# Patient Record
Sex: Female | Born: 1970 | Race: Black or African American | Hispanic: No | Marital: Single | Smoking: Never smoker
Health system: Southern US, Community
[De-identification: ages and names within clinical notes are randomized; demographics above are authoritative.]

## PROBLEM LIST (undated history)

## (undated) DIAGNOSIS — I10 Essential (primary) hypertension: Secondary | ICD-10-CM

## (undated) DIAGNOSIS — J45909 Unspecified asthma, uncomplicated: Secondary | ICD-10-CM

## (undated) HISTORY — PX: CHOLECYSTECTOMY: SHX55

## (undated) HISTORY — PX: IR EMBO TUMOR ORGAN ISCHEMIA INFARCT INC GUIDE ROADMAPPING: IMG5449

## (undated) HISTORY — DX: Unspecified asthma, uncomplicated: J45.909

## (undated) HISTORY — DX: Essential (primary) hypertension: I10

---

## 2020-02-14 ENCOUNTER — Emergency Department (HOSPITAL_BASED_OUTPATIENT_CLINIC_OR_DEPARTMENT_OTHER)
Admission: EM | Admit: 2020-02-14 | Discharge: 2020-02-14 | Disposition: A | Discharge status: Home / Self Care | Payer: BC Managed Care – PPO | Attending: Emergency Medicine | Admitting: Emergency Medicine

## 2020-02-14 ENCOUNTER — Emergency Department (HOSPITAL_BASED_OUTPATIENT_CLINIC_OR_DEPARTMENT_OTHER): Payer: BC Managed Care – PPO

## 2020-02-14 ENCOUNTER — Other Ambulatory Visit: Payer: Self-pay

## 2020-02-14 ENCOUNTER — Encounter (HOSPITAL_BASED_OUTPATIENT_CLINIC_OR_DEPARTMENT_OTHER): Payer: Self-pay | Admitting: Emergency Medicine

## 2020-02-14 DIAGNOSIS — I10 Essential (primary) hypertension: Secondary | ICD-10-CM | POA: Insufficient documentation

## 2020-02-14 DIAGNOSIS — R05 Cough: Secondary | ICD-10-CM | POA: Diagnosis not present

## 2020-02-14 DIAGNOSIS — M47814 Spondylosis without myelopathy or radiculopathy, thoracic region: Secondary | ICD-10-CM | POA: Diagnosis not present

## 2020-02-14 DIAGNOSIS — J45909 Unspecified asthma, uncomplicated: Secondary | ICD-10-CM | POA: Diagnosis not present

## 2020-02-14 DIAGNOSIS — R531 Weakness: Secondary | ICD-10-CM | POA: Diagnosis not present

## 2020-02-14 DIAGNOSIS — D509 Iron deficiency anemia, unspecified: Secondary | ICD-10-CM | POA: Diagnosis not present

## 2020-02-14 DIAGNOSIS — R000 Tachycardia, unspecified: Secondary | ICD-10-CM | POA: Diagnosis not present

## 2020-02-14 DIAGNOSIS — U071 COVID-19: Secondary | ICD-10-CM | POA: Insufficient documentation

## 2020-02-14 DIAGNOSIS — Z9049 Acquired absence of other specified parts of digestive tract: Secondary | ICD-10-CM | POA: Diagnosis not present

## 2020-02-14 DIAGNOSIS — R0602 Shortness of breath: Secondary | ICD-10-CM | POA: Diagnosis not present

## 2020-02-14 LAB — URINALYSIS, ROUTINE W REFLEX MICROSCOPIC
Bilirubin Urine: NEGATIVE
Glucose, UA: NEGATIVE mg/dL
Hgb urine dipstick: NEGATIVE
Ketones, ur: NEGATIVE mg/dL
Leukocytes,Ua: NEGATIVE
Nitrite: NEGATIVE
Protein, ur: 100 mg/dL — AB
Specific Gravity, Urine: 1.015 (ref 1.005–1.030)
pH: 7 (ref 5.0–8.0)

## 2020-02-14 LAB — URINALYSIS, MICROSCOPIC (REFLEX)

## 2020-02-14 LAB — COMPREHENSIVE METABOLIC PANEL
ALT: 12 U/L (ref 0–44)
AST: 21 U/L (ref 15–41)
Albumin: 3.7 g/dL (ref 3.5–5.0)
Alkaline Phosphatase: 55 U/L (ref 38–126)
Anion gap: 13 (ref 5–15)
BUN: 14 mg/dL (ref 6–20)
CO2: 24 mmol/L (ref 22–32)
Calcium: 8.8 mg/dL — ABNORMAL LOW (ref 8.9–10.3)
Chloride: 102 mmol/L (ref 98–111)
Creatinine, Ser: 1.15 mg/dL — ABNORMAL HIGH (ref 0.44–1.00)
GFR calc Af Amer: >60 mL/min (ref >60)
GFR calc non Af Amer: 56 mL/min — ABNORMAL LOW (ref >60)
Glucose, Bld: 123 mg/dL — ABNORMAL HIGH (ref 70–99)
Potassium: 3.4 mmol/L — ABNORMAL LOW (ref 3.5–5.1)
Sodium: 139 mmol/L (ref 135–145)
Total Bilirubin: 0.5 mg/dL (ref 0.3–1.2)
Total Protein: 7.9 g/dL (ref 6.5–8.1)

## 2020-02-14 LAB — D-DIMER, QUANTITATIVE: D-Dimer, Quant: 1.29 ug/mL-FEU — ABNORMAL HIGH (ref 0.00–0.50)

## 2020-02-14 MED ORDER — SODIUM CHLORIDE 0.9 % IV BOLUS
1000.0000 mL | Freq: Once | INTRAVENOUS | Status: AC
  Administered 2020-02-14: 1000 mL via INTRAVENOUS

## 2020-02-14 MED ORDER — IOHEXOL 350 MG/ML SOLN
100.0000 mL | Freq: Once | INTRAVENOUS | Status: AC | PRN
  Administered 2020-02-14: 100 mL via INTRAVENOUS

## 2020-02-14 NOTE — ED Provider Notes (Signed)
[facility] EMERGENCY DEPARTMENT Provider Note   CSN: [id number] Arrival date & time: 02/14/20  1546     History Chief Complaint  Patient presents with  . Cough  . Weakness    Diana Johnson is a 49 y.o. female.  Patient is a 49 year old female with a history of asthma and hypertension who presents with weakness and fatigue following a Covid infection in August.  She was diagnosed with Covid with symptoms starting around August 10.  She said she was at home for about 2 weeks feeling bad but then she went back to work on September 3.  She said she has had ongoing profound fatigue and generalized weakness.  She gets fatigued with minimal activity.  She said yesterday she had a syncopal episode.  She has some shortness of breath but not with her normal activities.  No associated chest pain.  She still having some intermittent fevers up to 101.  She is taking Tylenol to control the fevers.  She denies any nausea, vomiting or diarrhea.  No urinary symptoms.  She does still have coughing spells.  No ongoing congestion.        Past Medical History:  Diagnosis Date  . Asthma   . Hypertension     There are no problems to display for this patient.    The histories are not reviewed yet. Please review them in the "History" navigator section and refresh this SmartLink.   OB History   No obstetric history on file.     No family history on file.  Social History   Tobacco Use  . Smoking status: Never Smoker  . Smokeless tobacco: Never Used  Substance Use Topics  . Alcohol use: Never  . Drug use: Never    Home Medications Prior to Admission medications   Not on File    Allergies    Patient has no allergy information on record.  Review of Systems   Review of Systems  Constitutional: Positive for fatigue and fever. Negative for chills and diaphoresis.  HENT: Negative for congestion, rhinorrhea and sneezing.   Eyes: Negative.   Respiratory: Positive for  cough and shortness of breath. Negative for chest tightness.   Cardiovascular: Negative for chest pain and leg swelling.  Gastrointestinal: Negative for abdominal pain, blood in stool, diarrhea, nausea and vomiting.  Genitourinary: Negative for difficulty urinating, flank pain, frequency and hematuria.  Musculoskeletal: Negative for arthralgias and back pain.  Skin: Negative for rash.  Neurological: Positive for syncope. Negative for dizziness, speech difficulty, weakness, numbness and headaches.    Physical Exam Updated Vital Signs BP (!) 164/100 (BP Location: Left Arm)   Pulse 95   Temp 98.3 F (36.8 C) (Oral)   Resp 16   LMP 01/31/2020 (Approximate)   SpO2 100%   Physical Exam Constitutional:      Appearance: She is well-developed.  HENT:     Head: Normocephalic and atraumatic.  Eyes:     Pupils: Pupils are equal, round, and reactive to light.  Cardiovascular:     Rate and Rhythm: Regular rhythm. Tachycardia present.     Heart sounds: Normal heart sounds.  Pulmonary:     Effort: Pulmonary effort is normal. No respiratory distress.     Breath sounds: Normal breath sounds. No wheezing or rales.  Chest:     Chest wall: No tenderness.  Abdominal:     General: Bowel sounds are normal.     Palpations: Abdomen is soft.     Tenderness: There  is no abdominal tenderness. There is no guarding or rebound.  Musculoskeletal:        General: Normal range of motion.     Cervical back: Normal range of motion and neck supple.     Comments: No edema or calf tenderness  Lymphadenopathy:     Cervical: No cervical adenopathy.  Skin:    General: Skin is warm and dry.     Findings: No rash.  Neurological:     Mental Status: She is alert and oriented to person, place, and time.     ED Results / Procedures / Treatments   Labs (all labs ordered are listed, but only abnormal results are displayed) Labs Reviewed  COMPREHENSIVE METABOLIC PANEL - Abnormal; Notable for the following  components:      Result Value   Potassium 3.4 (*)    Glucose, Bld 123 (*)    Creatinine, Ser 1.15 (*)    Calcium 8.8 (*)    GFR calc non Af Amer 56 (*)    All other components within normal limits  CBC WITH DIFFERENTIAL/PLATELET - Abnormal; Notable for the following components:   WBC 11.0 (*)    Hemoglobin 8.3 (*)    HCT 31.2 (*)    MCV 65.4 (*)    MCH 17.4 (*)    MCHC 26.6 (*)    RDW 23.9 (*)    Platelets 418 (*)    Eosinophils Absolute 0.6 (*)    All other components within normal limits  URINALYSIS, ROUTINE W REFLEX MICROSCOPIC - Abnormal; Notable for the following components:   Protein, ur 100 (*)    All other components within normal limits  D-DIMER, QUANTITATIVE (NOT AT [facility]) - Abnormal; Notable for the following components:   D-Dimer, Quant 1.29 (*)    All other components within normal limits  URINALYSIS, MICROSCOPIC (REFLEX) - Abnormal; Notable for the following components:   Bacteria, UA FEW (*)    All other components within normal limits    EKG EKG Interpretation  Date/Time:  Monday February 14 2020 20:22:35 EDT Ventricular Rate:  96 PR Interval:    QRS Duration: 90 QT Interval:  337 QTC Calculation: 426 R Axis:   59 Text Interpretation: Sinus rhythm Probable LVH with secondary repol abnrm No old tracing to compare Confirmed by Rolan Bucco [phone]) on 02/14/2020 8:46:31 PM   Radiology CT Angio Chest PE W/Cm &/Or Wo Cm  Result Date: 02/14/2020 CLINICAL DATA:  Cough and weakness with history of COVID-19 positivity EXAM: CT ANGIOGRAPHY CHEST WITH CONTRAST TECHNIQUE: Multidetector CT imaging of the chest was performed using the standard protocol during bolus administration of intravenous contrast. Multiplanar CT image reconstructions and MIPs were obtained to evaluate the vascular anatomy. CONTRAST:  OMNIPAQUE IOHEXOL 350 MG/ML SOLN COMPARISON:  Chest x-ray from earlier in the same day. FINDINGS: Cardiovascular: Thoracic aorta and its branches are within  normal limits. No cardiac enlargement is seen. The pulmonary artery shows a normal branching pattern. No definitive filling defect to suggest pulmonary embolism is noted. Mediastinum/Nodes: Thoracic inlet is within normal limits. The esophagus as visualized is unremarkable. No sizable hilar or mediastinal adenopathy is noted. Lungs/Pleura: Lungs are well aerated bilaterally. Minimal ground-glass opacities are seen consistent with residual changes from the patient's known history of COVID-19 positivity. No sizable confluent infiltrate is seen. No pneumothorax or pleural effusion is noted. Upper Abdomen: Changes of prior cholecystectomy are seen. The remainder of the upper abdomen appears within normal limits. Musculoskeletal: Degenerative changes of the thoracic spine  are seen. No acute bony abnormality is noted. Review of the MIP images confirms the above findings. IMPRESSION: No evidence of pulmonary emboli. Minimal ground-glass opacities within the lungs bilaterally consistent with the known history of prior COVID-19 positivity. No other focal abnormality is seen. Electronically Signed   By: Alcide Clever M.D.   On: 02/14/2020 21:13   DG Chest Portable 1 View  Result Date: 02/14/2020 CLINICAL DATA:  Cough. EXAM: PORTABLE CHEST 1 VIEW COMPARISON:  None. FINDINGS: The heart size and mediastinal contours are within normal limits. Both lungs are clear. The visualized skeletal structures are unremarkable. IMPRESSION: No active disease. Electronically Signed   By: Aram Candela M.D.   On: 02/14/2020 18:41    Procedures Procedures (including critical care time)  Medications Ordered in ED Medications  sodium chloride 0.9 % bolus 1,000 mL ( Intravenous Stopped 02/14/20 2138)  iohexol (OMNIPAQUE) 350 MG/ML injection 100 mL (100 mLs Intravenous Contrast Given 02/14/20 2102)    ED Course  I have reviewed the triage vital signs and the nursing notes.  Pertinent labs & imaging results that were available  during my care of the patient were reviewed by me and considered in my medical decision making (see chart for details).    MDM Rules/Calculators/A&P                          Patient is a 49 year old female who had Covid last month.  She has had some ongoing weakness and had a syncopal episode yesterday.  She was mildly tachycardic on arrival but this is improved and normalized after IV fluids.  Her labs show an anemia with a hemoglobin around 8.  She says that this is normal for her.  She says her hemoglobin normally runs between 8 and 10.  She does have heavy menstrual cycles at times.  She supposed to be taking iron supplements but has not been taking them.  I encouraged her to take them.  Her creatinine is minimally elevated, likely due to some mild dehydration.  She was given IV fluids.  She had an elevated D-dimer and had a CT scan of her chest.  There is no evidence of PE.  There are some typical Covid lung findings.  No evidence of acute pneumonia.  Her other lab work is nonconcerning.  She is not hypoxic.  She is feeling somewhat better after IV fluids.  She was discharged home in good condition.  She was given a referral to follow-up with the post Covid clinic.  Return precautions were given. Final Clinical Impression(s) / ED Diagnoses Final diagnoses:  COVID-19 virus infection  Iron deficiency anemia, unspecified iron deficiency anemia type    Rx / DC Orders ED Discharge Orders    None       Rolan Bucco, MD 02/14/20 2255

## 2020-02-14 NOTE — Discharge Instructions (Addendum)
Make sure that you are staying hydrated.  Take iron supplements.  Make an appointment to follow-up with the Covid follow-up clinic.  Return here as needed if you have any worsening symptoms.

## 2020-02-14 NOTE — ED Notes (Signed)
PT aware of need for UA 

## 2020-02-14 NOTE — ED Triage Notes (Signed)
Here with cough during exertion and weakness that is still persistent from having COVID in August.

## 2020-02-15 LAB — CBC WITH DIFFERENTIAL/PLATELET
Abs Immature Granulocytes: 0.03 10*3/uL (ref 0.00–0.07)
Basophils Absolute: 0.1 10*3/uL (ref 0.0–0.1)
Basophils Relative: 1 %
Eosinophils Absolute: 0.6 10*3/uL — ABNORMAL HIGH (ref 0.0–0.5)
Eosinophils Relative: 6 %
HCT: 31.2 % — ABNORMAL LOW (ref 36.0–46.0)
Hemoglobin: 8.3 g/dL — ABNORMAL LOW (ref 12.0–15.0)
Immature Granulocytes: 0 %
Lymphocytes Relative: 19 %
Lymphs Abs: 2.1 10*3/uL (ref 0.7–4.0)
MCH: 17.4 pg — ABNORMAL LOW (ref 26.0–34.0)
MCHC: 26.6 g/dL — ABNORMAL LOW (ref 30.0–36.0)
MCV: 65.4 fL — ABNORMAL LOW (ref 80.0–100.0)
Monocytes Absolute: 0.9 10*3/uL (ref 0.1–1.0)
Monocytes Relative: 8 %
Neutro Abs: 7.3 10*3/uL (ref 1.7–7.7)
Neutrophils Relative %: 66 %
Platelets: 418 10*3/uL — ABNORMAL HIGH (ref 150–400)
RBC: 4.77 MIL/uL (ref 3.87–5.11)
RDW: 23.9 % — ABNORMAL HIGH (ref 11.5–15.5)
WBC: 11 10*3/uL — ABNORMAL HIGH (ref 4.0–10.5)
nRBC: 0 % (ref 0.0–0.2)

## 2020-02-18 ENCOUNTER — Telehealth: Payer: Self-pay | Admitting: General Practice

## 2020-02-18 NOTE — Telephone Encounter (Signed)
Pt called to schedule appt w/ [facility] per referral from [facility]. Unable to lvm bc of full mailbox

## 2020-02-25 ENCOUNTER — Ambulatory Visit (INDEPENDENT_AMBULATORY_CARE_PROVIDER_SITE_OTHER): Payer: BC Managed Care – PPO | Admitting: Nurse Practitioner

## 2020-02-25 ENCOUNTER — Other Ambulatory Visit: Payer: Self-pay

## 2020-02-25 VITALS — BP 138/82 | HR 107 | Temp 97.3°F | Ht 67.0 in | Wt 220.0 lb

## 2020-02-25 DIAGNOSIS — J4520 Mild intermittent asthma, uncomplicated: Secondary | ICD-10-CM

## 2020-02-25 DIAGNOSIS — Z8616 Personal history of COVID-19: Secondary | ICD-10-CM | POA: Diagnosis not present

## 2020-02-25 MED ORDER — AZITHROMYCIN 250 MG PO TABS: ORAL_TABLET | ORAL | 0 refills | Status: DC

## 2020-02-25 MED ORDER — BENZONATATE 200 MG PO CAPS: 200.0000 mg | ORAL_CAPSULE | Freq: Two times a day (BID) | ORAL | 0 refills | Status: DC | PRN

## 2020-02-25 MED ORDER — ALBUTEROL SULFATE HFA 108 (90 BASE) MCG/ACT IN AERS: 2.0000 | INHALATION_SPRAY | Freq: Four times a day (QID) | RESPIRATORY_TRACT | 0 refills | Status: AC | PRN

## 2020-02-25 MED ORDER — PREDNISONE 20 MG PO TABS: 20.0000 mg | ORAL_TABLET | Freq: Every day | ORAL | 0 refills | Status: AC

## 2020-02-25 MED ORDER — FLUCONAZOLE 150 MG PO TABS: 150.0000 mg | ORAL_TABLET | Freq: Once | ORAL | 0 refills | Status: AC

## 2020-02-25 MED ORDER — BUDESONIDE-FORMOTEROL FUMARATE 80-4.5 MCG/ACT IN AERO: 2.0000 | INHALATION_SPRAY | Freq: Two times a day (BID) | RESPIRATORY_TRACT | 3 refills | Status: AC

## 2020-02-25 MED ORDER — MONTELUKAST SODIUM 10 MG PO TABS: 10.0000 mg | ORAL_TABLET | Freq: Every day | ORAL | 3 refills | Status: DC

## 2020-02-25 NOTE — Progress Notes (Signed)
@Patient  ID: , female    DOB: [birth year], 49 y.o.   MRN: [id number]  Chief Complaint  Patient presents with  . New Patient (Initial Visit)    COVID Pos: 8/10 Sx; Fever, fatigue, cough     Referring provider: No ref. provider found   49 year old female with history of hypertension and asthma.  Diagnosed with Covid on 01/04/2020.  HPI  Patient presents today for post COVID care clinic visit.  She was diagnosed with Covid on 1821.  She had an ED visit on 02/14/2020.  Chest x-ray did show pneumonia.  Patient did not receive any treatment.  She has been using supportive measures at home.  She states she continues to have cough and shortness of breath at times.  She also complains of ongoing fatigue.  Patient does have a history of asthma but has not recently been on maintenance medication because she had lost her insurance and did not have a PCP anymore.  She now has insurance and we gave her a list of providers in the area that are accepting new patients.  Will restart patient on Symbicort and Singulair today. Denies f/c/s, n/v/d, hemoptysis, PND, chest pain or edema.      Allergies  Allergen Reactions  . Shellfish Allergy      There is no immunization history on file for this patient.  Past Medical History:  Diagnosis Date  . Asthma   . Hypertension     Tobacco History: Social History   Tobacco Use  Smoking Status Never Smoker  Smokeless Tobacco Never Used   Counseling given: Not Answered   Outpatient Encounter Medications as of 02/25/2020  Medication Sig  . albuterol (VENTOLIN HFA) 108 (90 Base) MCG/ACT inhaler Inhale 2 puffs into the lungs every 6 (six) hours as needed for wheezing or shortness of breath.  04/26/2020 azithromycin (ZITHROMAX) 250 MG tablet Take 2 tablets (500 mg) on day 1, then take 1 tablet (250 mg) on days 2-5  . benzonatate (TESSALON) 200 MG capsule Take 1 capsule (200 mg total) by mouth 2 (two) times daily as needed for cough.  .  budesonide-formoterol (SYMBICORT) 80-4.5 MCG/ACT inhaler Inhale 2 puffs into the lungs 2 (two) times daily.  . fluconazole (DIFLUCAN) 150 MG tablet Take 1 tablet (150 mg total) by mouth once for 1 dose.  . montelukast (SINGULAIR) 10 MG tablet Take 1 tablet (10 mg total) by mouth at bedtime.  . predniSONE (DELTASONE) 20 MG tablet Take 1 tablet (20 mg total) by mouth daily with breakfast for 5 days.   No facility-administered encounter medications on file as of 02/25/2020.     Review of Systems  Review of Systems  Constitutional: Positive for fatigue. Negative for fever.  HENT: Negative.   Respiratory: Positive for cough and shortness of breath.   Cardiovascular: Negative.  Negative for chest pain, palpitations and leg swelling.  Gastrointestinal: Negative.   Allergic/Immunologic: Negative.   Neurological: Negative.   Psychiatric/Behavioral: Negative.        Physical Exam  BP 138/82 (BP Location: Right Arm)   Pulse (!) 107   Temp (!) 97.3 F (36.3 C)   Ht 5\' 7"  (1.702 m)   Wt 220 lb (99.8 kg)   LMP 01/31/2020 (Approximate)   SpO2 97%   BMI 34.46 kg/m   Wt Readings from Last 5 Encounters:  02/25/20 220 lb (99.8 kg)     Physical Exam Vitals and nursing note reviewed.  Constitutional:      General: She is not  in acute distress.    Appearance: She is well-developed.  Cardiovascular:     Rate and Rhythm: Normal rate and regular rhythm.  Pulmonary:     Effort: Pulmonary effort is normal.     Breath sounds: Normal breath sounds.  Musculoskeletal:     Right lower leg: No edema.     Left lower leg: No edema.  Neurological:     Mental Status: She is alert and oriented to person, place, and time.  Psychiatric:        Mood and Affect: Mood normal.        Behavior: Behavior normal.       Imaging: CT Angio Chest PE W/Cm &/Or Wo Cm  Result Date: 02/14/2020 CLINICAL DATA:  Cough and weakness with history of COVID-19 positivity EXAM: CT ANGIOGRAPHY CHEST WITH CONTRAST  TECHNIQUE: Multidetector CT imaging of the chest was performed using the standard protocol during bolus administration of intravenous contrast. Multiplanar CT image reconstructions and MIPs were obtained to evaluate the vascular anatomy. CONTRAST:  OMNIPAQUE IOHEXOL 350 MG/ML SOLN COMPARISON:  Chest x-ray from earlier in the same day. FINDINGS: Cardiovascular: Thoracic aorta and its branches are within normal limits. No cardiac enlargement is seen. The pulmonary artery shows a normal branching pattern. No definitive filling defect to suggest pulmonary embolism is noted. Mediastinum/Nodes: Thoracic inlet is within normal limits. The esophagus as visualized is unremarkable. No sizable hilar or mediastinal adenopathy is noted. Lungs/Pleura: Lungs are well aerated bilaterally. Minimal ground-glass opacities are seen consistent with residual changes from the patient's known history of COVID-19 positivity. No sizable confluent infiltrate is seen. No pneumothorax or pleural effusion is noted. Upper Abdomen: Changes of prior cholecystectomy are seen. The remainder of the upper abdomen appears within normal limits. Musculoskeletal: Degenerative changes of the thoracic spine are seen. No acute bony abnormality is noted. Review of the MIP images confirms the above findings. IMPRESSION: No evidence of pulmonary emboli. Minimal ground-glass opacities within the lungs bilaterally consistent with the known history of prior COVID-19 positivity. No other focal abnormality is seen. Electronically Signed   By: Alcide Clever M.D.   On: 02/14/2020 21:13   DG Chest Portable 1 View  Result Date: 02/14/2020 CLINICAL DATA:  Cough. EXAM: PORTABLE CHEST 1 VIEW COMPARISON:  None. FINDINGS: The heart size and mediastinal contours are within normal limits. Both lungs are clear. The visualized skeletal structures are unremarkable. IMPRESSION: No active disease. Electronically Signed   By: Aram Candela M.D.   On: 02/14/2020 18:41      Assessment & Plan:   History of COVID-19 Cough:   Stay well hydrated  Stay active  Deep breathing exercises  May start vitamin C 2,000 mg daily, vitamin D3 2,000 IU daily, Zinc 220 mg daily, and Quercetin 500 mg twice daily  May take tylenol or fever or pain  May take mucinex DM twice daily  Will order azithromycin  Will order prednisone  Will order tessalon perles  History of asthma:  Will order albuterol  Will order Symbicort  Will order Singulair    Follow up:  Follow up in 4 weeks or sooner if needed - will need repeat chest xray      Ivonne Andrew, NP 02/25/2020

## 2020-02-25 NOTE — Assessment & Plan Note (Signed)
Cough:   Stay well hydrated  Stay active  Deep breathing exercises  May start vitamin C 2,000 mg daily, vitamin D3 2,000 IU daily, Zinc 220 mg daily, and Quercetin 500 mg twice daily  May take tylenol or fever or pain  May take mucinex DM twice daily  Will order azithromycin  Will order prednisone  Will order tessalon perles  History of asthma:  Will order albuterol  Will order Symbicort  Will order Singulair    Follow up:  Follow up in 4 weeks or sooner if needed - will need repeat chest xray

## 2020-02-25 NOTE — Patient Instructions (Addendum)
Covid 19 Cough:   Stay well hydrated  Stay active  Deep breathing exercises  May start vitamin C 2,000 mg daily, vitamin D3 2,000 IU daily, Zinc 220 mg daily, and Quercetin 500 mg twice daily  May take tylenol or fever or pain  May take mucinex DM twice daily  Will order azithromycin  Will order prednisone  Will order tessalon perles  History of asthma:  Will order albuterol  Will order Symbicort  Will order Singulair    Follow up:  Follow up in 4 weeks or sooner if needed - will need repeat chest xray

## 2020-03-24 ENCOUNTER — Ambulatory Visit (INDEPENDENT_AMBULATORY_CARE_PROVIDER_SITE_OTHER): Payer: BC Managed Care – PPO | Admitting: Nurse Practitioner

## 2020-03-24 VITALS — BP 130/88 | HR 104 | Temp 96.9°F | Ht 67.0 in | Wt 225.0 lb

## 2020-03-24 DIAGNOSIS — Z8709 Personal history of other diseases of the respiratory system: Secondary | ICD-10-CM | POA: Diagnosis not present

## 2020-03-24 DIAGNOSIS — Z8616 Personal history of COVID-19: Secondary | ICD-10-CM

## 2020-03-24 DIAGNOSIS — G9331 Postviral fatigue syndrome: Secondary | ICD-10-CM

## 2020-03-24 DIAGNOSIS — G933 Postviral fatigue syndrome: Secondary | ICD-10-CM

## 2020-03-24 DIAGNOSIS — R059 Cough, unspecified: Secondary | ICD-10-CM

## 2020-03-24 DIAGNOSIS — R5381 Other malaise: Secondary | ICD-10-CM | POA: Insufficient documentation

## 2020-03-24 MED ORDER — CETIRIZINE HCL 10 MG PO TABS: 10.0000 mg | ORAL_TABLET | Freq: Every day | ORAL | 11 refills | Status: AC

## 2020-03-24 MED ORDER — OMEPRAZOLE 20 MG PO CPDR: 20.0000 mg | DELAYED_RELEASE_CAPSULE | Freq: Every day | ORAL | 3 refills | Status: AC

## 2020-03-24 NOTE — Progress Notes (Signed)
@Patient  ID: , female    DOB: 1970/09/22, 49 y.o.   MRN: [id number]  Chief Complaint  Patient presents with  . Follow-up    Still fatigue and coughing, Starting to lose hair    Referring provider: No ref. provider found  50 year old female with history of hypertension and asthma.  Diagnosed with Covid on 01/04/2020.   HPI   Patient presents today for post COVID care clinic visit.  She was diagnosed with Covid on 01/04/20.  She had an ED visit on 02/14/2020.  Chest x-ray did show pneumonia.  Patient did not receive any treatment.  AT last visit here patient was order albuterol, Symbicort, and Singulair for history of asthma, but could not afford Symbicort. She states that she has been compliant with Singulair and albuterol.  She did complete a course of azithromycin and prednisone since last visit.  She states that it did help while she was taking it but then her cough returned after she stopped taking the medications.  She also complains of extreme fatigue. Denies f/c/s, n/v/d, hemoptysis, PND, chest pain or edema.         Allergies  Allergen Reactions  . Shellfish Allergy      There is no immunization history on file for this patient.  Past Medical History:  Diagnosis Date  . Asthma   . Hypertension     Tobacco History: Social History   Tobacco Use  Smoking Status Never Smoker  Smokeless Tobacco Never Used   Counseling given: Not Answered   Outpatient Encounter Medications as of 03/24/2020  Medication Sig  . albuterol (VENTOLIN HFA) 108 (90 Base) MCG/ACT inhaler Inhale 2 puffs into the lungs every 6 (six) hours as needed for wheezing or shortness of breath.  . montelukast (SINGULAIR) 10 MG tablet Take 1 tablet (10 mg total) by mouth at bedtime.  . benzonatate (TESSALON) 200 MG capsule Take 1 capsule (200 mg total) by mouth 2 (two) times daily as needed for cough. (Patient not taking: Reported on 03/24/2020)  . budesonide-formoterol (SYMBICORT)  80-4.5 MCG/ACT inhaler Inhale 2 puffs into the lungs 2 (two) times daily. (Patient not taking: Reported on 03/24/2020)  . cetirizine (ZYRTEC) 10 MG tablet Take 1 tablet (10 mg total) by mouth daily.  03/26/2020 omeprazole (PRILOSEC) 20 MG capsule Take 1 capsule (20 mg total) by mouth daily.  . [DISCONTINUED] azithromycin (ZITHROMAX) 250 MG tablet Take 2 tablets (500 mg) on day 1, then take 1 tablet (250 mg) on days 2-5   No facility-administered encounter medications on file as of 03/24/2020.     Review of Systems  Review of Systems  Constitutional: Positive for fatigue. Negative for fever.  HENT: Negative.   Respiratory: Positive for cough. Negative for shortness of breath.   Cardiovascular: Negative.  Negative for chest pain, palpitations and leg swelling.  Gastrointestinal: Negative.   Allergic/Immunologic: Negative.   Neurological: Negative.   Psychiatric/Behavioral: Negative.        Physical Exam  BP 130/88 (BP Location: Right Arm)   Pulse (!) 104   Temp (!) 96.9 F (36.1 C)   Ht 5\' 7"  (1.702 m)   Wt 225 lb (102.1 kg)   SpO2 97%   BMI 35.24 kg/m   Wt Readings from Last 5 Encounters:  03/24/20 225 lb (102.1 kg)  02/25/20 220 lb (99.8 kg)     Physical Exam Vitals and nursing note reviewed.  Constitutional:      General: She is not in acute distress.  Appearance: She is well-developed.  Cardiovascular:     Rate and Rhythm: Normal rate and regular rhythm.  Pulmonary:     Effort: Pulmonary effort is normal.     Breath sounds: Normal breath sounds.  Musculoskeletal:     Right lower leg: No edema.     Left lower leg: No edema.  Neurological:     Mental Status: She is alert and oriented to person, place, and time.  Psychiatric:        Mood and Affect: Mood normal.        Behavior: Behavior normal.        Assessment & Plan:   History of COVID-19 Cough:   Stay well hydrated  Stay active  Deep breathing exercises  May take tylenol or fever or  pain  May take mucinex DM twice daily  Will order zyrtec  Will order Prilosec  Will order repeat chest xray  Will place referral to pulmonary    History of asthma:  Continue albuterol  Insurance would not cover Symbicort  Continue Singulair   Fatigue:  Will check labs  Will order PT   Follow up:  Follow up in 2 months or sooner if needed       Ivonne Andrew, NP 03/24/2020

## 2020-03-24 NOTE — Assessment & Plan Note (Signed)
Cough:   Stay well hydrated  Stay active  Deep breathing exercises  May take tylenol or fever or pain  May take mucinex DM twice daily  Will order zyrtec  Will order Prilosec  Will order repeat chest xray  Will place referral to pulmonary    History of asthma:  Continue albuterol  Insurance would not cover Symbicort  Continue Singulair   Fatigue:  Will check labs  Will order PT   Follow up:  Follow up in 2 months or sooner if needed

## 2020-03-24 NOTE — Patient Instructions (Signed)
History of COVID-19 Cough:   Stay well hydrated  Stay active  Deep breathing exercises  May take tylenol or fever or pain  May take mucinex DM twice daily  Will order zyrtec  Will order Prilosec  Will order repeat chest xray  Will place referral to pulmonary    History of asthma:  Continue albuterol  Insurance would not cover Symbicort  Continue Singulair   Fatigue:  Will check labs  Will order PT   Follow up:  Follow up in 2 months or sooner if needed

## 2020-03-25 LAB — CBC
Hematocrit: 34.5 % (ref 34.0–46.6)
Hemoglobin: 9.9 g/dL — ABNORMAL LOW (ref 11.1–15.9)
MCH: 19.2 pg — ABNORMAL LOW (ref 26.6–33.0)
MCHC: 28.7 g/dL — ABNORMAL LOW (ref 31.5–35.7)
MCV: 67 fL — ABNORMAL LOW (ref 79–97)
Platelets: 485 10*3/uL — ABNORMAL HIGH (ref 150–450)
RBC: 5.15 x10E6/uL (ref 3.77–5.28)
RDW: 23.8 % — ABNORMAL HIGH (ref 11.7–15.4)
WBC: 10.1 10*3/uL (ref 3.4–10.8)

## 2020-03-25 LAB — COMPREHENSIVE METABOLIC PANEL
ALT: 15 IU/L (ref 0–32)
AST: 16 IU/L (ref 0–40)
Albumin/Globulin Ratio: 1.4 (ref 1.2–2.2)
Albumin: 4.2 g/dL (ref 3.8–4.8)
Alkaline Phosphatase: 73 IU/L (ref 44–121)
BUN/Creatinine Ratio: 12 (ref 9–23)
BUN: 11 mg/dL (ref 6–24)
Bilirubin Total: 0.3 mg/dL (ref 0.0–1.2)
CO2: 19 mmol/L — ABNORMAL LOW (ref 20–29)
Calcium: 9.6 mg/dL (ref 8.7–10.2)
Chloride: 101 mmol/L (ref 96–106)
Creatinine, Ser: 0.9 mg/dL (ref 0.57–1.00)
GFR calc Af Amer: 87 mL/min/{1.73_m2} (ref >59)
GFR calc non Af Amer: 76 mL/min/{1.73_m2} (ref >59)
Globulin, Total: 3.1 g/dL (ref 1.5–4.5)
Glucose: 87 mg/dL (ref 65–99)
Potassium: 4.4 mmol/L (ref 3.5–5.2)
Sodium: 137 mmol/L (ref 134–144)
Total Protein: 7.3 g/dL (ref 6.0–8.5)

## 2020-04-06 ENCOUNTER — Encounter: Payer: Self-pay | Admitting: Internal Medicine

## 2020-04-06 ENCOUNTER — Ambulatory Visit: Payer: BC Managed Care – PPO | Admitting: Internal Medicine

## 2020-04-06 ENCOUNTER — Ambulatory Visit (INDEPENDENT_AMBULATORY_CARE_PROVIDER_SITE_OTHER): Payer: BC Managed Care – PPO

## 2020-04-06 ENCOUNTER — Other Ambulatory Visit: Payer: Self-pay

## 2020-04-06 DIAGNOSIS — J4520 Mild intermittent asthma, uncomplicated: Secondary | ICD-10-CM | POA: Diagnosis not present

## 2020-04-06 DIAGNOSIS — J45909 Unspecified asthma, uncomplicated: Secondary | ICD-10-CM | POA: Diagnosis not present

## 2020-04-06 DIAGNOSIS — Z8616 Personal history of COVID-19: Secondary | ICD-10-CM

## 2020-04-06 DIAGNOSIS — J90 Pleural effusion, not elsewhere classified: Secondary | ICD-10-CM | POA: Diagnosis not present

## 2020-04-06 NOTE — Assessment & Plan Note (Signed)
Dx Jan 13 2020 no specific rx  - cxr clear 04/06/2020   Rec:  Keep walking and Make sure you check your oxygen saturations at highest level of activity to be sure it stays over 90% and keep track of it at least once a week, more often if breathing getting worse, and let me know if losing ground.           Each maintenance medication was reviewed in detail including emphasizing most importantly the difference between maintenance and prns and under what circumstances the prns are to be triggered using an action plan format where appropriate.  Total time for H and P, chart review, counseling, teaching device and generating customized AVS unique to this office visit / charting = 53 min

## 2020-04-06 NOTE — Progress Notes (Addendum)
Diana Johnson, female    DOB: [birth year],    MRN: [id number]   Brief patient profile:  17 yobf never smoker good athlete with problems with seasonal rhinitis spring = fall testing ? Tested in HS rx otc never wheeze or cough and overall pattern worse over the years less responsive to clariton and but eventually  Developed ? EIA around 2016 rx albuterol and good control then dxccovid 19 Aug 8th 2021 with cough / sob s monoclonal rx or admit >>>   Better since starting symbicort/ change zyrtec last pred oct 2021 referred to pulmonary clinic 04/06/2020 by Angus Seller NP     History of Present Illness  04/06/2020  Pulmonary/ 1st office eval/Kista Robb  Chief Complaint  Patient presents with  . Consult    cough is better now, but her asthma is still not better after having covid.   Dyspnea:  Steps are a problem, gained 30 lbs on prednisone p covid, otherwise breathing much better vs prior Cough: none now Sleep: flat bed/ 2 pillows / no resp symptoms since on symbicort  SABA use: since symbicort only needs saba 1-2 x  Week despite poor hfa teachniqe  No obvious day to day or daytime variability or assoc excess/ purulent sputum or mucus plugs or hemoptysis or cp or chest tightness, subjective wheeze or overt sinus or hb symptoms.   Sleeping  without nocturnal  or early am exacerbation  of respiratory  c/o's or need for noct saba. Also denies any obvious fluctuation of symptoms with weather or environmental changes or other aggravating or alleviating factors except as outlined above   No unusual exposure hx or h/o childhood pna/ asthma or knowledge of premature birth.  Current Allergies, Complete Past Medical History, Past Surgical History, Family History, and Social History were reviewed in [employer] record.  ROS  The following are not active complaints unless bolded Hoarseness, sore throat, dysphagia, dental problems, itching, sneezing,  nasal congestion or discharge  of excess mucus or purulent secretions, ear ache,   fever, chills, sweats, unintended wt loss or wt gain, classically pleuritic or exertional cp,  orthopnea pnd or arm/hand swelling  or leg swelling, presyncope, palpitations, abdominal pain, anorexia, nausea, vomiting, diarrhea  or change in bowel habits or change in bladder habits, change in stools or change in urine, dysuria, hematuria,  rash, arthralgias, visual complaints, headache, numbness, weakness or ataxia or problems with walking or coordination,  change in mood or  memory.           Past Medical History:  Diagnosis Date  . Asthma   . Hypertension     Outpatient Medications Prior to Visit  Medication Sig Dispense Refill  . albuterol (VENTOLIN HFA) 108 (90 Base) MCG/ACT inhaler Inhale 2 puffs into the lungs every 6 (six) hours as needed for wheezing or shortness of breath. 8 g 0  . budesonide-formoterol (SYMBICORT) 80-4.5 MCG/ACT inhaler Inhale 2 puffs into the lungs 2 (two) times daily. 1 each 3  . cetirizine (ZYRTEC) 10 MG tablet Take 1 tablet (10 mg total) by mouth daily. 30 tablet 11  . montelukast (SINGULAIR) 10 MG tablet Take 1 tablet (10 mg total) by mouth at bedtime. 30 tablet 3  . omeprazole (PRILOSEC) 20 MG capsule Take 1 capsule (20 mg total) by mouth daily. 30 capsule 3  . benzonatate (TESSALON) 200 MG capsule Take 1 capsule (200 mg total) by mouth 2 (two) times daily as needed for cough. (Patient not taking: Reported on 03/24/2020)  20 capsule 0   No facility-administered medications prior to visit.     Objective:     BP 128/70   Pulse 82   Temp 97.6 F (36.4 C) (Oral)   Ht 5\' 7"  (1.702 m)   Wt 226 lb 8 oz (102.7 kg)   LMP 02/03/2020 (Approximate)   SpO2 97%   BMI 35.47 kg/m   SpO2: 97 %    HEENT : pt wearing mask not removed for exam due to covid -19 concerns.    NECK :  without JVD/Nodes/TM/ nl carotid upstrokes bilaterally   LUNGS: no acc muscle use,  Nl contour chest which is clear to A and P  bilaterally without cough on insp or exp maneuvers   CV:  RRR  no s3 or murmur or increase in P2, and no edema   ABD:  soft and nontender with nl inspiratory excursion in the supine position. No bruits or organomegaly appreciated, bowel sounds nl  MS:  Nl gait/ ext warm without deformities, calf tenderness, cyanosis or clubbing No obvious joint restrictions   SKIN: warm and dry without lesions    NEURO:  alert, approp, nl sensorium with  no motor or cerebellar deficits apparent.     CXR PA and Lateral:   04/06/2020 :    I personally reviewed images and agree with radiology impression as follows:    Negative for acute cardiopulmonary disease    Assessment   Mild intermittent asthma Onset ? Around 2016 on background of lifelong seasonal rhinitis - 04/06/2020  After extensive coaching inhaler device,  effectiveness =    75% from a baseline of 25% > continue symbicort 160 2bid    DDX of  difficult airways management almost all start with A and  include Adherence, Ace Inhibitors, Acid Reflux, Active Sinus Disease, Alpha 1 Antitripsin deficiency, Anxiety masquerading as Airways dz,  ABPA,  Allergy(esp in young), Aspiration (esp in elderly), Adverse effects of meds,  Active smoking or vaping, A bunch of PE's (a small clot burden can't cause this syndrome unless there is already severe underlying pulm or vascular dz with poor reserve) plus two Bs  = Bronchiectasis and Beta blocker use..and one C= CHF   Adherence is always the initial "prime suspect" and is a multilayered concern that requires a "trust but verify" approach in every patient - starting with knowing how to use medications, especially inhalers, correctly, keeping up with refills and understanding the fundamental difference between maintenance and prns vs those medications only taken for a very short course and then stopped and not refilled.  - see hfa teaching - return with all meds in hand using a trust but verify approach to  confirm accurate Medication  Reconciliation The principal here is that until we are certain that the  patients are doing what we've asked, it makes no sense to ask them to do more.   ? Allergy > continue singulair, just use low dose symbicort 80 but apply it more consistently / effectively   ? Acid (or non-acid) GERD > always difficult to exclude as up to 75% of pts in some series report no assoc GI/ Heartburn symptoms> rec continue max (24h)  acid suppression and diet restrictions/ reviewed      >>> f/u in 4 weeks     History of COVID-19 Dx Jan 13 2020 no specific rx  - cxr clear 04/06/2020   Rec:  Keep walking and Make sure you check your oxygen saturations at highest level of activity  to be sure it stays over 90% and keep track of it at least once a week, more often if breathing getting worse, and let me know if losing ground.        Each maintenance medication was reviewed in detail including emphasizing most importantly the difference between maintenance and prns and under what circumstances the prns are to be triggered using an action plan format where appropriate.  Total time for H and P, chart review, counseling, teaching device and generating customized AVS unique to this office visit / charting = 53 min           Sandrea Hughs, MD 04/06/2020

## 2020-04-06 NOTE — Patient Instructions (Signed)
Plan A = Automatic = Always=   Symbicort  80 Take 2 puffs first thing in am and then another 2 puffs about 12 hours later.   Work on inhaler technique:  relax and gently blow all the way out then take a nice smooth deep breath back in, triggering the inhaler at same time you start breathing in.  Hold for up to 5 seconds if you can. Blow out thru nose. Rinse and gargle with water when done  Keep the cannister for training    Plan B = Backup (to supplement plan A, not to replace it) Only use your albuterol inhaler as a rescue medication to be used if you can't catch your breath by resting or doing a relaxed purse lip breathing pattern.  - The less you use it, the better it will work when you need it. - Ok to use the inhaler up to 2 puffs  every 4 hours if you must but call for appointment if use goes up over your usual need - Don't leave home without it !!  (think of it like the spare tire for your car)   Try albuterol 15 min before an activity that you know would make you short of breath and see if it makes any difference and if makes none then don't take it after activity unless you can't catch your breath.      Make sure you check your oxygen saturations at highest level of activity to be sure it stays over 90% and keep track of it at least once a week, more often if breathing getting worse, and let me know if losing ground.    Please remember to go to the  x-ray department  for your tests - we will call you with the results when they are available     Please schedule a follow up office visit in 4 weeks, sooner if needed  with all medications /inhalers/ solutions in hand so we can verify exactly what you are taking. This includes all medications from all doctors and over the counters

## 2020-04-06 NOTE — Assessment & Plan Note (Signed)
Onset ? Around 2016 on background of lifelong seasonal rhinitis - 04/06/2020  After extensive coaching inhaler device,  effectiveness =    75% from a baseline of 25% > continue symbicort 160 2bid    DDX of  difficult airways management almost all start with A and  include Adherence, Ace Inhibitors, Acid Reflux, Active Sinus Disease, Alpha 1 Antitripsin deficiency, Anxiety masquerading as Airways dz,  ABPA,  Allergy(esp in young), Aspiration (esp in elderly), Adverse effects of meds,  Active smoking or vaping, A bunch of PE's (a small clot burden can't cause this syndrome unless there is already severe underlying pulm or vascular dz with poor reserve) plus two Bs  = Bronchiectasis and Beta blocker use..and one C= CHF   Adherence is always the initial "prime suspect" and is a multilayered concern that requires a "trust but verify" approach in every patient - starting with knowing how to use medications, especially inhalers, correctly, keeping up with refills and understanding the fundamental difference between maintenance and prns vs those medications only taken for a very short course and then stopped and not refilled.  - see hfa teaching - return with all meds in hand using a trust but verify approach to confirm accurate Medication  Reconciliation The principal here is that until we are certain that the  patients are doing what we've asked, it makes no sense to ask them to do more.   ? Allergy > continue singulair, just use low dose symbicort 80 but apply it more consistently / effectively   ? Acid (or non-acid) GERD > always difficult to exclude as up to 75% of pts in some series report no assoc GI/ Heartburn symptoms> rec continue max (24h)  acid suppression and diet restrictions/ reviewed      >>> f/u in 4 weeks

## 2020-04-12 ENCOUNTER — Ambulatory Visit: Payer: BC Managed Care – PPO | Admitting: Nurse Practitioner

## 2020-04-17 ENCOUNTER — Encounter: Payer: Self-pay | Admitting: Nurse Practitioner

## 2020-04-17 ENCOUNTER — Other Ambulatory Visit: Payer: Self-pay

## 2020-04-17 ENCOUNTER — Ambulatory Visit (INDEPENDENT_AMBULATORY_CARE_PROVIDER_SITE_OTHER): Payer: BC Managed Care – PPO | Admitting: Nurse Practitioner

## 2020-04-17 VITALS — BP 175/97 | HR 97 | Temp 98.2°F | Resp 20 | Ht 67.0 in | Wt 229.0 lb

## 2020-04-17 DIAGNOSIS — Z8616 Personal history of COVID-19: Secondary | ICD-10-CM

## 2020-04-17 DIAGNOSIS — Z130 Encounter for screening for diseases of the blood and blood-forming organs and certain disorders involving the immune mechanism: Secondary | ICD-10-CM

## 2020-04-17 DIAGNOSIS — Z13220 Encounter for screening for lipoid disorders: Secondary | ICD-10-CM

## 2020-04-17 DIAGNOSIS — J4520 Mild intermittent asthma, uncomplicated: Secondary | ICD-10-CM

## 2020-04-17 DIAGNOSIS — Z131 Encounter for screening for diabetes mellitus: Secondary | ICD-10-CM

## 2020-04-17 DIAGNOSIS — I10 Essential (primary) hypertension: Secondary | ICD-10-CM

## 2020-04-17 DIAGNOSIS — Z1329 Encounter for screening for other suspected endocrine disorder: Secondary | ICD-10-CM | POA: Diagnosis not present

## 2020-04-17 MED ORDER — AMLODIPINE BESYLATE 5 MG PO TABS: 5.0000 mg | ORAL_TABLET | Freq: Every day | ORAL | 3 refills | Status: AC

## 2020-04-17 MED ORDER — AEROCHAMBER PLUS FLO-VU LARGE MISC: 1.0000 | Freq: Once | 0 refills | Status: AC

## 2020-04-17 MED ORDER — CLONIDINE HCL 0.1 MG PO TABS
0.2000 mg | ORAL_TABLET | Freq: Once | ORAL | Status: AC
  Administered 2020-04-17: 0.2 mg via ORAL

## 2020-04-17 NOTE — Progress Notes (Signed)
[facility] Patient [facility] [address] [place], [place]  [address] Phone:  [phone]   Fax:  [phone]   New Patient Office Visit  Subjective:  Patient ID: Diana Johnson, female    DOB: [birth year]  Age: 49 y.o. MRN: [id number]  CC: No chief complaint on file.   HPI Diana Johnson presents to establish care. She  has a past medical history of Asthma and Hypertension.   She has a personal history of coronavirus.  She admits that she continues to improve.  She does continue to have some shortness of breath.  She has exercise-induced asthma.  She does have inhalers which is effective.  She did follow-up with pulmonology where she had additional education on her inhaler use.  She is also on montelukast which she also feels is effective. 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 This is encouraged.   She admits that she has had elevated blood pressure.  She denies previous history of hypertension.  She has had a increase in weight post coronavirus.  She admits that this is due to the steroid use and overall isolation.  She has not been monitoring her blood pressure at home.  She does complain of headaches.  She denies any dizziness, chest pains or swelling in her legs feet or ankles.  She admits that she has had an increase salt intake and minimal exercise. Past Medical History:  Diagnosis Date  . Asthma   . Hypertension     Past Surgical History:  Procedure Laterality Date  . CHOLECYSTECTOMY    . IR EMBO TUMOR ORGAN ISCHEMIA INFARCT INC GUIDE ROADMAPPING      Family History  Problem Relation Age of Onset  . Diabetes Paternal Uncle   . Hyperlipidemia Maternal Grandmother   . Hyperlipidemia Maternal Grandfather   . Diabetes Maternal Grandfather   . Hyperlipidemia Paternal Grandmother   . Hyperlipidemia Paternal Grandfather     Social History   Socioeconomic History  . Marital status: Single    Spouse  name: Not on file  . Number of children: 1  . Years of education: Not on file  . Highest education level: Not on file  Occupational History  . Not on file  Tobacco Use  . Smoking status: Never Smoker  . Smokeless tobacco: Never Used  Substance and Sexual Activity  . Alcohol use: Never  . Drug use: Never  . Sexual activity: Not Currently  Other Topics Concern  . Not on file  Social History Narrative  . Not on file   Social Determinants of Health   Financial Resource Strain:   . Difficulty of Paying Living Expenses: Not on file  Food Insecurity:   . Worried About Programme researcher, broadcasting/film/video in the Last Year: Not on file  . Ran Out of Food in the Last Year: Not on file  Transportation Needs:   . Lack of Transportation (Medical): Not on file  . Lack of Transportation (Non-Medical): Not on file  Physical Activity:   . Days of Exercise per Week: Not on file  . Minutes of Exercise per Session: Not on file  Stress:   . Feeling of Stress : Not on file  Social Connections:   . Frequency of Communication with Friends and Family: Not on file  . Frequency of Social Gatherings with Friends and Family: Not on file  . Attends Religious Services: Not on file  . Active  Member of Clubs or Organizations: Not on file  . Attends Banker Meetings: Not on file  . Marital Status: Not on file  Intimate Partner Violence:   . Fear of Current or Ex-Partner: Not on file  . Emotionally Abused: Not on file  . Physically Abused: Not on file  . Sexually Abused: Not on file    ROS Review of Systems  HENT: Negative.   Eyes: Negative.   Respiratory: Positive for shortness of breath.   Cardiovascular: Negative for chest pain.  Gastrointestinal: Negative for constipation, diarrhea, nausea and vomiting.  Endocrine: Negative.   Genitourinary: Negative.   Musculoskeletal: Negative.   Skin: Negative.        Discoloration on right side of face  Allergic/Immunologic: Negative.   Neurological:  Positive for headaches (occasioinal with allergies).  Hematological: Negative.   Psychiatric/Behavioral: Negative.     Objective:   Today's Vitals: BP (!) 175/97 (BP Location: Right Arm, Patient Position: Sitting, Cuff Size: Large)   Pulse 97   Temp 98.2 F (36.8 C)   Resp 20   Ht 5\' 7"  (1.702 m)   Wt 229 lb (103.9 kg)   SpO2 100%   BMI 35.87 kg/m   Physical Exam Constitutional:      General: She is not in acute distress.    Appearance: She is obese. She is not ill-appearing, toxic-appearing or diaphoretic.  HENT:     Head: Normocephalic and atraumatic.     Nose: Nose normal.     Mouth/Throat:     Mouth: Mucous membranes are moist.  Cardiovascular:     Rate and Rhythm: Normal rate and regular rhythm.     Pulses: Normal pulses.     Heart sounds: Normal heart sounds.  Pulmonary:     Effort: Pulmonary effort is normal.     Breath sounds: Normal breath sounds.  Abdominal:     General: Bowel sounds are normal.     Palpations: Abdomen is soft.  Musculoskeletal:        General: Normal range of motion.     Cervical back: Normal range of motion.  Skin:    General: Skin is warm and dry.     Capillary Refill: Capillary refill takes less than 2 seconds.  Neurological:     General: No focal deficit present.     Mental Status: She is alert and oriented to person, place, and time.  Psychiatric:        Mood and Affect: Mood normal.        Behavior: Behavior normal.        Thought Content: Thought content normal.        Judgment: Judgment normal.     Assessment & Plan:   Problem List Items Addressed This Visit      Respiratory   Mild intermittent asthma Stable continue with current regimen\ Inhaler spacer ordered     Other   History of COVID-19 - Primary    Other Visit Diagnoses    Screening for cholesterol level       Relevant Orders   Lipid panel   Screening for thyroid disorder       Relevant Orders   TSH   Screening for diabetes mellitus (DM)        Relevant Orders   Comp. Metabolic Panel (12)   POCT glycosylated hemoglobin (Hb A1C)   Screening for deficiency anemia       Relevant Orders   CBC with Differential/Platelet   Primary hypertension  Started Amlodipine 5 mg daily  Encouraged home monitoring and recording BP <130/80 Eating a heart-healthy diet with less salt Encouraged regular physical activity  Recommend Weight loss Will continue to closely monitor as lifestyle modifications; goal is to not to have manage medically.    Relevant Medications   cloNIDine (CATAPRES) tablet 0.2 mg   amLODipine (NORVASC) 5 MG tablet      Outpatient Encounter Medications as of 04/17/2020  Medication Sig  . albuterol (VENTOLIN HFA) 108 (90 Base) MCG/ACT inhaler Inhale 2 puffs into the lungs every 6 (six) hours as needed for wheezing or shortness of breath.  Marland Kitchen amLODipine (NORVASC) 5 MG tablet Take 1 tablet (5 mg total) by mouth daily.  . budesonide-formoterol (SYMBICORT) 80-4.5 MCG/ACT inhaler Inhale 2 puffs into the lungs 2 (two) times daily.  . cetirizine (ZYRTEC) 10 MG tablet Take 1 tablet (10 mg total) by mouth daily.  . montelukast (SINGULAIR) 10 MG tablet Take 1 tablet (10 mg total) by mouth at bedtime.  Marland Kitchen omeprazole (PRILOSEC) 20 MG capsule Take 1 capsule (20 mg total) by mouth daily.  . [EXPIRED] Spacer/Aero-Holding Chambers (AEROCHAMBER PLUS FLO-VU LARGE) MISC 1 each by Other route once for 1 dose.  . [DISCONTINUED] benzonatate (TESSALON) 200 MG capsule Take 1 capsule (200 mg total) by mouth 2 (two) times daily as needed for cough. (Patient not taking: Reported on 03/24/2020)   Facility-Administered Encounter Medications as of 04/17/2020  Medication  . cloNIDine (CATAPRES) tablet 0.2 mg    Follow-up: Return in about 4 weeks (around 05/15/2020).   Barbette Merino, NP

## 2020-04-17 NOTE — Patient Instructions (Addendum)
Health Maintenance, Female Adopting a healthy lifestyle and getting preventive care are important in promoting health and wellness. Ask your health care provider about:  The right schedule for you to have regular tests and exams.  Things you can do on your own to prevent diseases and keep yourself healthy. What should I know about diet, weight, and exercise? Eat a healthy diet   Eat a diet that includes plenty of vegetables, fruits, low-fat dairy products, and lean protein.  Do not eat a lot of foods that are high in solid fats, added sugars, or sodium. Maintain a healthy weight Body mass index (BMI) is used to identify weight problems. It estimates body fat based on height and weight. Your health care provider can help determine your BMI and help you achieve or maintain a healthy weight. Get regular exercise Get regular exercise. This is one of the most important things you can do for your health. Most adults should:  Exercise for at least 150 minutes each week. The exercise should increase your heart rate and make you sweat (moderate-intensity exercise).  Do strengthening exercises at least twice a week. This is in addition to the moderate-intensity exercise.  Spend less time sitting. Even light physical activity can be beneficial. Watch cholesterol and blood lipids Have your blood tested for lipids and cholesterol at 48 years of age, then have this test every 5 years. Have your cholesterol levels checked more often if:  Your lipid or cholesterol levels are high.  You are older than 48 years of age.  You are at high risk for heart disease. What should I know about cancer screening? Depending on your health history and family history, you may need to have cancer screening at various ages. This may include screening for:  Breast cancer.  Cervical cancer.  Colorectal cancer.  Skin cancer.  Lung cancer. What should I know about heart disease, diabetes, and high blood  pressure? Blood pressure and heart disease  High blood pressure causes heart disease and increases the risk of stroke. This is more likely to develop in people who have high blood pressure readings, are of African descent, or are overweight.  Have your blood pressure checked: ? Every 3-5 years if you are 18-39 years of age. ? Every year if you are 40 years old or older. Diabetes Have regular diabetes screenings. This checks your fasting blood sugar level. Have the screening done:  Once every three years after age 40 if you are at a normal weight and have a low risk for diabetes.  More often and at a younger age if you are overweight or have a high risk for diabetes. What should I know about preventing infection? Hepatitis B If you have a higher risk for hepatitis B, you should be screened for this virus. Talk with your health care provider to find out if you are at risk for hepatitis B infection. Hepatitis C Testing is recommended for:  Everyone born from 1945 through 1965.  Anyone with known risk factors for hepatitis C. Sexually transmitted infections (STIs)  Get screened for STIs, including gonorrhea and chlamydia, if: ? You are sexually active and are younger than 48 years of age. ? You are older than 48 years of age and your health care provider tells you that you are at risk for this type of infection. ? Your sexual activity has changed since you were last screened, and you are at increased risk for chlamydia or gonorrhea. Ask your health care provider if   you are at risk.  Ask your health care provider about whether you are at high risk for HIV. Your health care provider may recommend a prescription medicine to help prevent HIV infection. If you choose to take medicine to prevent HIV, you should first get tested for HIV. You should then be tested every 3 months for as long as you are taking the medicine. Pregnancy  If you are about to stop having your period (premenopausal) and  you may become pregnant, seek counseling before you get pregnant.  Take 400 to 800 micrograms (mcg) of folic acid every day if you become pregnant.  Ask for birth control (contraception) if you want to prevent pregnancy. Osteoporosis and menopause Osteoporosis is a disease in which the bones lose minerals and strength with aging. This can result in bone fractures. If you are 49 years old or older, or if you are at risk for osteoporosis and fractures, ask your health care provider if you should:  Be screened for bone loss.  Take a calcium or vitamin D supplement to lower your risk of fractures.  Be given hormone replacement therapy (HRT) to treat symptoms of menopause. Follow these instructions at home: Lifestyle  Do not use any products that contain nicotine or tobacco, such as cigarettes, e-cigarettes, and chewing tobacco. If you need help quitting, ask your health care provider.  Do not use street drugs.  Do not share needles.  Ask your health care provider for help if you need support or information about quitting drugs. Alcohol use  Do not drink alcohol if: ? Your health care provider tells you not to drink. ? You are pregnant, may be pregnant, or are planning to become pregnant.  If you drink alcohol: ? Limit how much you use to 0-1 drink a day. ? Limit intake if you are breastfeeding.  Be aware of how much alcohol is in your drink. In the [place] one drink equals one 12 oz bottle of beer (355 mL), one 5 oz glass of wine (148 mL), or one 1 oz glass of hard liquor (44 mL). General instructions  Schedule regular health, dental, and eye exams.  Stay current with your vaccines.  Tell your health care provider if: ? You often feel depressed. ? You have ever been abused or do not feel safe at home. Summary  Adopting a healthy lifestyle and getting preventive care are important in promoting health and wellness.  Follow your health care provider's instructions about healthy  diet, exercising, and getting tested or screened for diseases.  Follow your health care provider's instructions on monitoring your cholesterol and blood pressure. This information is not intended to replace advice given to you by your health care provider. Make sure you discuss any questions you have with your health care provider. Document Revised: 05/06/2018 Document Reviewed: 05/06/2018 Elsevier Patient Education  2020 [employer].   Managing Your Hypertension Hypertension is commonly called high blood pressure. This is when the force of your blood pressing against the walls of your arteries is too strong. Arteries are blood vessels that carry blood from your heart throughout your body. Hypertension forces the heart to work harder to pump blood, and may cause the arteries to become narrow or stiff. Having untreated or uncontrolled hypertension can cause heart attack, stroke, kidney disease, and other problems. What are blood pressure readings? A blood pressure reading consists of a higher number over a lower number. Ideally, your blood pressure should be below 120/80. The first ("top") number is  called the systolic pressure. It is a measure of the pressure in your arteries as your heart beats. The second ("bottom") number is called the diastolic pressure. It is a measure of the pressure in your arteries as the heart relaxes. What does my blood pressure reading mean? Blood pressure is classified into four stages. Based on your blood pressure reading, your health care provider may use the following stages to determine what type of treatment you need, if any. Systolic pressure and diastolic pressure are measured in a unit called mm Hg. Normal  Systolic pressure: below 120.  Diastolic pressure: below 80. Elevated  Systolic pressure: 120-129.  Diastolic pressure: below 80. Hypertension stage 1  Systolic pressure: 130-139.  Diastolic pressure: 80-89. Hypertension stage 2  Systolic  pressure: 140 or above.  Diastolic pressure: 90 or above. What health risks are associated with hypertension? Managing your hypertension is an important responsibility. Uncontrolled hypertension can lead to:  A heart attack.  A stroke.  A weakened blood vessel (aneurysm).  Heart failure.  Kidney damage.  Eye damage.  Metabolic syndrome.  Memory and concentration problems. What changes can I make to manage my hypertension? Hypertension can be managed by making lifestyle changes and possibly by taking medicines. Your health care provider will help you make a plan to bring your blood pressure within a normal range. Eating and drinking   Eat a diet that is high in fiber and potassium, and low in salt (sodium), added sugar, and fat. An example eating plan is called the DASH (Dietary Approaches to Stop Hypertension) diet. To eat this way: ? Eat plenty of fresh fruits and vegetables. Try to fill half of your plate at each meal with fruits and vegetables. ? Eat whole grains, such as whole wheat pasta, brown rice, or whole grain bread. Fill about one quarter of your plate with whole grains. ? Eat low-fat diary products. ? Avoid fatty cuts of meat, processed or cured meats, and poultry with skin. Fill about one quarter of your plate with lean proteins such as fish, chicken without skin, beans, eggs, and tofu. ? Avoid premade and processed foods. These tend to be higher in sodium, added sugar, and fat.  Reduce your daily sodium intake. Most people with hypertension should eat less than 1,500 mg of sodium a day.  Limit alcohol intake to no more than 1 drink a day for nonpregnant women and 2 drinks a day for men. One drink equals 12 oz of beer, 5 oz of wine, or 1 oz of hard liquor. Lifestyle  Work with your health care provider to maintain a healthy body weight, or to lose weight. Ask what an ideal weight is for you.  Get at least 30 minutes of exercise that causes your heart to beat  faster (aerobic exercise) most days of the week. Activities may include walking, swimming, or biking.  Include exercise to strengthen your muscles (resistance exercise), such as weight lifting, as part of your weekly exercise routine. Try to do these types of exercises for 30 minutes at least 3 days a week.  Do not use any products that contain nicotine or tobacco, such as cigarettes and e-cigarettes. If you need help quitting, ask your health care provider.  Control any long-term (chronic) conditions you have, such as high cholesterol or diabetes. Monitoring  Monitor your blood pressure at home as told by your health care provider. Your personal target blood pressure may vary depending on your medical conditions, your age, and other factors.  provider because there may be side effects or interactions. Talk with your health care provider about your diet, exercise habits, and other lifestyle factors that may be contributing to hypertension. Visit your health care provider regularly. Your health care provider can help you create and adjust your plan for managing hypertension. Will I need medicine to control my blood pressure? Your health care provider may prescribe medicine if lifestyle changes are not enough to get your blood pressure under control, and if: Your systolic blood pressure is 130 or higher. Your diastolic blood pressure is 80 or higher. Take medicines only as told by your health care provider. Follow the directions carefully. Blood pressure medicines must be taken as prescribed. The medicine does not work as well when you skip doses. Skipping doses also puts you at risk for problems. Contact a health care provider if: You think you are having a reaction to medicines you have taken. You have repeated (recurrent) headaches. You feel dizzy. You have swelling in your  ankles. You have trouble with your vision. Get help right away if: You develop a severe headache or confusion. You have unusual weakness or numbness, or you feel faint. You have severe pain in your chest or abdomen. You vomit repeatedly. You have trouble breathing. Summary Hypertension is when the force of blood pumping through your arteries is too strong. If this condition is not controlled, it may put you at risk for serious complications. Your personal target blood pressure may vary depending on your medical conditions, your age, and other factors. For most people, a normal blood pressure is less than 120/80. Hypertension is managed by lifestyle changes, medicines, or both. Lifestyle changes include weight loss, eating a healthy, low-sodium diet, exercising more, and limiting alcohol. This information is not intended to replace advice given to you by your health care provider. Make sure you discuss any questions you have with your health care provider. Document Revised: 09/04/2018 Document Reviewed: 04/10/2016 Elsevier Patient Education  2020 Elsevier Inc.  

## 2020-05-03 ENCOUNTER — Ambulatory Visit: Payer: BC Managed Care – PPO | Admitting: Adult Health

## 2020-05-08 ENCOUNTER — Ambulatory Visit: Payer: BC Managed Care – PPO | Admitting: Nurse Practitioner

## 2020-05-15 ENCOUNTER — Other Ambulatory Visit: Payer: Self-pay

## 2020-05-15 ENCOUNTER — Ambulatory Visit (INDEPENDENT_AMBULATORY_CARE_PROVIDER_SITE_OTHER): Payer: BC Managed Care – PPO | Admitting: Nurse Practitioner

## 2020-05-15 ENCOUNTER — Encounter: Payer: Self-pay | Admitting: Nurse Practitioner

## 2020-05-15 VITALS — BP 166/71 | HR 109 | Temp 98.1°F | Ht 67.0 in

## 2020-05-15 DIAGNOSIS — I10 Essential (primary) hypertension: Secondary | ICD-10-CM | POA: Diagnosis not present

## 2020-05-15 DIAGNOSIS — Z1329 Encounter for screening for other suspected endocrine disorder: Secondary | ICD-10-CM | POA: Diagnosis not present

## 2020-05-15 DIAGNOSIS — Z13220 Encounter for screening for lipoid disorders: Secondary | ICD-10-CM | POA: Diagnosis not present

## 2020-05-15 DIAGNOSIS — J4520 Mild intermittent asthma, uncomplicated: Secondary | ICD-10-CM

## 2020-05-15 DIAGNOSIS — Z8616 Personal history of COVID-19: Secondary | ICD-10-CM | POA: Diagnosis not present

## 2020-05-15 MED ORDER — MONTELUKAST SODIUM 10 MG PO TABS: 10.0000 mg | ORAL_TABLET | Freq: Every day | ORAL | 3 refills | Status: AC

## 2020-05-15 NOTE — Progress Notes (Signed)
[facility] Patient [facility] [address] [place], [place]  [address] Phone:  [phone]   Fax:  [phone]   Established Patient Office Visit  Subjective:  Patient ID: Diana Johnson, female    DOB: [birth year]  Age: 49 y.o. MRN: [id number]  CC:  Chief Complaint  Patient presents with  . Follow-up    Feeling better    HPI Diana Johnson presents for follow up. She  has a past medical history of Asthma and Hypertension.   Hypertension Patient is here for follow-up of elevated blood pressure. She is exercising and is adherent to a low-salt diet. Blood pressure is not being monitored at home.  She admits that she has missed some days of taking her medication.  Cardiac symptoms: none. Patient denies chest pain, dyspnea, fatigue, irregular heart beat, lower extremity edema, orthopnea and syncope. Cardiovascular risk factors: hypertension and obesity (BMI >= 30 kg/m2). Use of agents associated with hypertension: none. History of target organ damage: none.  Past Medical History:  Diagnosis Date  . Asthma   . Hypertension     Past Surgical History:  Procedure Laterality Date  . CHOLECYSTECTOMY    . IR EMBO TUMOR ORGAN ISCHEMIA INFARCT INC GUIDE ROADMAPPING      Family History  Problem Relation Age of Onset  . Diabetes Paternal Uncle   . Hyperlipidemia Maternal Grandmother   . Hyperlipidemia Maternal Grandfather   . Diabetes Maternal Grandfather   . Hyperlipidemia Paternal Grandmother   . Hyperlipidemia Paternal Grandfather     Social History   Socioeconomic History  . Marital status: Single    Spouse name: Not on file  . Number of children: 1  . Years of education: Not on file  . Highest education level: Not on file  Occupational History  . Not on file  Tobacco Use  . Smoking status: Never Smoker  . Smokeless tobacco: Never Used  Substance and Sexual Activity  . Alcohol use: Never  . Drug use: Never  . Sexual activity: Not Currently  Other Topics  Concern  . Not on file  Social History Narrative  . Not on file   Social Determinants of Health   Financial Resource Strain: Not on file  Food Insecurity: Not on file  Transportation Needs: Not on file  Physical Activity: Not on file  Stress: Not on file  Social Connections: Not on file  Intimate Partner Violence: Not on file    Outpatient Medications Prior to Visit  Medication Sig Dispense Refill  . albuterol (VENTOLIN HFA) 108 (90 Base) MCG/ACT inhaler Inhale 2 puffs into the lungs every 6 (six) hours as needed for wheezing or shortness of breath. 8 g 0  . amLODipine (NORVASC) 5 MG tablet Take 1 tablet (5 mg total) by mouth daily. 90 tablet 3  . budesonide-formoterol (SYMBICORT) 80-4.5 MCG/ACT inhaler Inhale 2 puffs into the lungs 2 (two) times daily. 1 each 3  . cetirizine (ZYRTEC) 10 MG tablet Take 1 tablet (10 mg total) by mouth daily. 30 tablet 11  . omeprazole (PRILOSEC) 20 MG capsule Take 1 capsule (20 mg total) by mouth daily. 30 capsule 3  . montelukast (SINGULAIR) 10 MG tablet Take 1 tablet (10 mg total) by mouth at bedtime. 30 tablet 3   No facility-administered medications prior to visit.    Allergies  Allergen Reactions  . Shellfish Allergy     ROS Review of Systems  Constitutional: Negative.   HENT: Negative.   Respiratory: Positive for shortness of  breath. Negative for cough, chest tightness and wheezing.   Cardiovascular: Negative for chest pain and leg swelling.  Gastrointestinal: Negative for nausea and vomiting.       Increased bloating she continues to use diary  Endocrine: Negative.   Genitourinary: Negative.   Musculoskeletal: Negative.   Neurological: Negative for dizziness and numbness.  Hematological: Negative.       Objective:    Physical Exam Constitutional:      Appearance: She is obese.  HENT:     Head: Normocephalic and atraumatic.     Nose: Nose normal.     Mouth/Throat:     Mouth: Mucous membranes are moist.  Cardiovascular:      Rate and Rhythm: Normal rate and regular rhythm.     Pulses: Normal pulses.     Heart sounds: Normal heart sounds.  Pulmonary:     Effort: Pulmonary effort is normal.     Breath sounds: Normal breath sounds.  Abdominal:     General: Bowel sounds are normal.  Musculoskeletal:        General: Normal range of motion.  Skin:    General: Skin is warm and dry.     Capillary Refill: Capillary refill takes less than 2 seconds.  Neurological:     General: No focal deficit present.     Mental Status: She is alert and oriented to person, place, and time.  Psychiatric:        Mood and Affect: Mood normal.        Behavior: Behavior normal.        Thought Content: Thought content normal.        Judgment: Judgment normal.     BP (!) 166/71   Pulse (!) 109   Temp 98.1 F (36.7 C) (Temporal)   Ht 5\' 7"  (1.702 m)   SpO2 100%   BMI 35.87 kg/m  Wt Readings from Last 3 Encounters:  04/17/20 229 lb (103.9 kg)  04/06/20 226 lb 8 oz (102.7 kg)  03/24/20 225 lb (102.1 kg)     There are no preventive care reminders to display for this patient.  There are no preventive care reminders to display for this patient.  No results found for: TSH Lab Results  Component Value Date   WBC 10.1 03/24/2020   HGB 9.9 (L) 03/24/2020   HCT 34.5 03/24/2020   MCV 67 (L) 03/24/2020   PLT 485 (H) 03/24/2020   Lab Results  Component Value Date   NA 137 03/24/2020   K 4.4 03/24/2020   CO2 19 (L) 03/24/2020   GLUCOSE 87 03/24/2020   BUN 11 03/24/2020   CREATININE 0.90 03/24/2020   BILITOT 0.3 03/24/2020   ALKPHOS 73 03/24/2020   AST 16 03/24/2020   ALT 15 03/24/2020   PROT 7.3 03/24/2020   ALBUMIN 4.2 03/24/2020   CALCIUM 9.6 03/24/2020   ANIONGAP 13 02/14/2020   No results found for: CHOL No results found for: HDL No results found for: LDLCALC No results found for: TRIG No results found for: CHOLHDL No results found for: 02/16/2020    Assessment & Plan:   Problem List Items Addressed  This Visit      Respiratory   Mild intermittent asthma   Relevant Medications   montelukast (SINGULAIR) 10 MG tablet     Other   History of COVID-19   Relevant Orders   TSH    Other Visit Diagnoses    Primary hypertension    -  Primary Encouraged on  going compliance with current medication regimen; Daily Encouraged home monitoring and recording BP <130/80 Eating a heart-healthy diet with less salt 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 Recommend Weight loss   Relevant Orders   Comp. Metabolic Panel (12)   Screening for cholesterol level       Relevant Orders   Lipid panel   Screening for thyroid disorder     Screening for evaluation of obesity.        Meds ordered this encounter  Medications  . montelukast (SINGULAIR) 10 MG tablet    Sig: Take 1 tablet (10 mg total) by mouth at bedtime.    Dispense:  90 tablet    Refill:  3    Order Specific Question:   Supervising Provider    Answer:   Quentin Angst [id number]    Follow-up: Return in about 3 months (around 08/13/2020) for follow up HTN.    Barbette Merino, NP

## 2020-05-16 LAB — LIPID PANEL
Chol/HDL Ratio: 3 ratio (ref 0.0–4.4)
Cholesterol, Total: 161 mg/dL (ref 100–199)
HDL: 54 mg/dL (ref >39)
LDL Chol Calc (NIH): 89 mg/dL (ref 0–99)
Triglycerides: 95 mg/dL (ref 0–149)
VLDL Cholesterol Cal: 18 mg/dL (ref 5–40)

## 2020-05-16 LAB — COMP. METABOLIC PANEL (12)
AST: 16 IU/L (ref 0–40)
Albumin/Globulin Ratio: 1 — ABNORMAL LOW (ref 1.2–2.2)
Albumin: 3.9 g/dL (ref 3.8–4.8)
Alkaline Phosphatase: 82 IU/L (ref 44–121)
BUN/Creatinine Ratio: 10 (ref 9–23)
BUN: 10 mg/dL (ref 6–24)
Bilirubin Total: 0.2 mg/dL (ref 0.0–1.2)
Calcium: 9.8 mg/dL (ref 8.7–10.2)
Chloride: 101 mmol/L (ref 96–106)
Creatinine, Ser: 0.98 mg/dL (ref 0.57–1.00)
GFR calc Af Amer: 79 mL/min/{1.73_m2} (ref >59)
GFR calc non Af Amer: 68 mL/min/{1.73_m2} (ref >59)
Globulin, Total: 3.8 g/dL (ref 1.5–4.5)
Glucose: 110 mg/dL — ABNORMAL HIGH (ref 65–99)
Potassium: 4.4 mmol/L (ref 3.5–5.2)
Sodium: 137 mmol/L (ref 134–144)
Total Protein: 7.7 g/dL (ref 6.0–8.5)

## 2020-05-16 LAB — TSH: TSH: 2.09 u[IU]/mL (ref 0.450–4.500)

## 2020-05-25 ENCOUNTER — Ambulatory Visit (INDEPENDENT_AMBULATORY_CARE_PROVIDER_SITE_OTHER): Payer: BC Managed Care – PPO | Admitting: Nurse Practitioner

## 2020-05-25 VITALS — BP 142/88 | HR 119 | Temp 97.0°F | Ht 67.0 in | Wt 236.0 lb

## 2020-05-25 DIAGNOSIS — Z8709 Personal history of other diseases of the respiratory system: Secondary | ICD-10-CM

## 2020-05-25 DIAGNOSIS — G933 Postviral fatigue syndrome: Secondary | ICD-10-CM

## 2020-05-25 DIAGNOSIS — R0602 Shortness of breath: Secondary | ICD-10-CM | POA: Diagnosis not present

## 2020-05-25 DIAGNOSIS — G9331 Postviral fatigue syndrome: Secondary | ICD-10-CM

## 2020-05-25 DIAGNOSIS — Z8616 Personal history of COVID-19: Secondary | ICD-10-CM

## 2020-05-25 NOTE — Patient Instructions (Signed)
History of COVID-19:   Stay well hydrated  Stay active  Deep breathing exercises  May take tylenol for fever or pain   Please keep follow up with pulmonary    History of asthma:  Continue albuterol  Continue symbicort  Continue Singulair   Fatigue:  Continue personal trainer   Follow up:  Follow up if needed

## 2020-05-25 NOTE — Assessment & Plan Note (Signed)
Stay well hydrated  Stay active  Deep breathing exercises  May take tylenol for fever or pain   Please keep follow up with pulmonary    History of asthma:  Continue albuterol  Continue symbicort  Continue Singulair   Fatigue:  Continue personal trainer   Follow up:  Follow up if needed

## 2020-05-25 NOTE — Progress Notes (Signed)
@Patient  ID: , female    DOB: December 24, 1970, 49 y.o.   MRN: [id number]  Chief Complaint  Patient presents with  . Follow-up    Still some SOB, will see pulmonary on 1/19.     Referring provider: No ref. provider found   49 year old female with history of hypertension and asthma. Diagnosed with Covid on 01/04/2020.  HPI  Patient presents today for post COVID care clinic visit/follow-up.  She was diagnosed with COVID on 01/04/2020.  Patient was last seen here on 03/24/2020.  She was referred to pulmonary at that time.  She has been seen by pulmonary and does have an upcoming follow-up in January.  Patient states that she is still having some issues with shortness of breath but her cough is much better now.  She has been using Symbicort and Singulair.  She has been trying to stay active.  She was unable to afford physical therapy.  She has been able to get a gym membership and does have a February there.  She is actively working on weight loss.  She is improving. Denies f/c/s, n/v/d, hemoptysis, PND, chest pain or edema.     Allergies  Allergen Reactions  . Shellfish Allergy      There is no immunization history on file for this patient.  Past Medical History:  Diagnosis Date  . Asthma   . Hypertension     Tobacco History: Social History   Tobacco Use  Smoking Status Never Smoker  Smokeless Tobacco Never Used   Counseling given: Yes   Outpatient Encounter Medications as of 05/25/2020  Medication Sig  . albuterol (VENTOLIN HFA) 108 (90 Base) MCG/ACT inhaler Inhale 2 puffs into the lungs every 6 (six) hours as needed for wheezing or shortness of breath.  05/27/2020 amLODipine (NORVASC) 5 MG tablet Take 1 tablet (5 mg total) by mouth daily.  . budesonide-formoterol (SYMBICORT) 80-4.5 MCG/ACT inhaler Inhale 2 puffs into the lungs 2 (two) times daily.  . cetirizine (ZYRTEC) 10 MG tablet Take 1 tablet (10 mg total) by mouth daily.  . montelukast (SINGULAIR) 10  MG tablet Take 1 tablet (10 mg total) by mouth at bedtime.  Marland Kitchen omeprazole (PRILOSEC) 20 MG capsule Take 1 capsule (20 mg total) by mouth daily.   No facility-administered encounter medications on file as of 05/25/2020.     Review of Systems  Review of Systems  Constitutional: Negative.  Negative for fatigue and fever.  HENT: Negative.   Respiratory: Positive for shortness of breath. Negative for cough.   Cardiovascular: Negative.  Negative for chest pain, palpitations and leg swelling.  Gastrointestinal: Negative.   Allergic/Immunologic: Negative.   Neurological: Negative.   Psychiatric/Behavioral: Negative.        Physical Exam  BP (!) 142/88 (BP Location: Right Arm)   Pulse (!) 119   Temp (!) 97 F (36.1 C)   Ht 5\' 7"  (1.702 m)   Wt 236 lb 0.1 oz (107.1 kg)   SpO2 98%   BMI 36.96 kg/m   Wt Readings from Last 5 Encounters:  05/25/20 236 lb 0.1 oz (107.1 kg)  04/17/20 229 lb (103.9 kg)  04/06/20 226 lb 8 oz (102.7 kg)  03/24/20 225 lb (102.1 kg)  02/25/20 220 lb (99.8 kg)     Physical Exam Vitals and nursing note reviewed.  Constitutional:      General: She is not in acute distress.    Appearance: She is well-developed and well-nourished.  Cardiovascular:     Rate and  Rhythm: Normal rate and regular rhythm.  Pulmonary:     Effort: Pulmonary effort is normal.     Breath sounds: Normal breath sounds.  Musculoskeletal:     Right lower leg: No edema.     Left lower leg: No edema.  Neurological:     Mental Status: She is alert and oriented to person, place, and time.  Psychiatric:        Mood and Affect: Mood and affect and mood normal.        Behavior: Behavior normal.        Assessment & Plan:   History of COVID-19 Stay well hydrated  Stay active  Deep breathing exercises  May take tylenol for fever or pain   Please keep follow up with pulmonary    History of asthma:  Continue albuterol  Continue symbicort  Continue  Singulair   Fatigue:  Continue personal trainer   Follow up:  Follow up if needed         Ivonne Andrew, NP 05/25/2020

## 2020-06-14 ENCOUNTER — Ambulatory Visit: Payer: BC Managed Care – PPO | Admitting: Internal Medicine

## 2020-06-15 ENCOUNTER — Ambulatory Visit: Payer: BC Managed Care – PPO | Admitting: Internal Medicine

## 2020-06-15 ENCOUNTER — Other Ambulatory Visit: Payer: Self-pay

## 2020-06-15 ENCOUNTER — Encounter: Payer: Self-pay | Admitting: Internal Medicine

## 2020-06-15 VITALS — BP 140/84 | HR 120 | Temp 97.6°F | Ht 67.0 in | Wt 237.4 lb

## 2020-06-15 DIAGNOSIS — J4520 Mild intermittent asthma, uncomplicated: Secondary | ICD-10-CM | POA: Diagnosis not present

## 2020-06-15 DIAGNOSIS — R059 Cough, unspecified: Secondary | ICD-10-CM

## 2020-06-15 NOTE — Patient Instructions (Signed)
I very strongly recommend you get the moderna or pfizer vaccine as soon as possible based on your risk of dying from the virus  and the proven safety and benefit of these vaccines against even the delta and omicron variants.  This can save your life as well as  those of your loved ones,  especially if they are also not vaccinated.     Work on inhaler technique:  relax and gently blow all the way out then take a nice smooth deep breath back in, triggering the inhaler at same time you start breathing in.  Hold for up to 5 seconds if you can. Blow out thru nose. Rinse and gargle with water when done     Please schedule a follow up visit in 3 months but call sooner if needed - don't use your medication that morning and we'll do PFTs on return

## 2020-06-15 NOTE — Progress Notes (Signed)
Diana Johnson, female    DOB: Jun 20, 1970,    MRN: [id number]   Brief patient profile:  87 yobf never smoker good athlete with problems with seasonal rhinitis spring = fall testing ? Tested in HS rx otc never wheeze or cough and overall pattern worse over the years less responsive to clariton and but eventually  Developed ? EIA around 2016 rx albuterol and good control then dx covid 19 Aug 8th 2021 with cough / sob s monoclonal rx or admit >>>   Better since starting symbicort/ change zyrtec last pred oct 2021 referred to pulmonary clinic 04/06/2020 by Angus Seller NP     History of Present Illness  04/06/2020  Pulmonary/ 1st office eval/[name]  Chief Complaint  Patient presents with  . Consult    cough is better now, but her asthma is still not better after having covid.   Dyspnea:  Steps are a problem, gained 30 lbs on prednisone p covid, otherwise breathing much better vs prior Cough: none now Sleep: flat bed/ 2 pillows / no resp symptoms since on symbicort  SABA use: since symbicort only needs saba 1-2 x  Week despite poor hfa teachniqe rec Plan A = Automatic = Always=   Symbicort  80 Take 2 puffs first thing in am and then another 2 puffs about 12 hours later.  Work on inhaler technique:   Keep the cannister for training  Plan B = Backup (to supplement plan A, not to replace it) Only use your albuterol inhaler as a rescue medication  Try albuterol 15 min before an activity that you know would make you short of breath   Make sure you check your oxygen saturations at highest level of activity  Please schedule a follow up office visit in 4 weeks, sooner if needed  with all medications /inhalers/ solutions in hand so we can verify exactly what you are taking. This includes all medications from all doctors and over the counters   06/15/2020  f/u ov/[name] re: asthma worse since covid 12/2019 - still not vaccinated  Chief Complaint  Patient presents with  . Follow-up    Doing good    Mild intermittent asthma Onset ? 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   Sleeping  without nocturnal  or early am exacerbation  of respiratory  c/o's or need for noct saba. Also denies any obvious fluctuation of symptoms with weather or environmental changes or other aggravating or alleviating factors except as outlined above   No unusual exposure hx or h/o childhood pna/ asthma or knowledge of premature birth.  Current Allergies, Complete Past Medical History, Past Surgical History, Family History, and Social History were reviewed in [employer] record.  ROS  The following are not active complaints unless bolded Hoarseness, sore throat, dysphagia, dental  problems, itching, sneezing,  nasal congestion or discharge of excess mucus or purulent secretions, ear ache,   fever, chills, sweats, unintended wt loss or wt gain, classically pleuritic or exertional cp,  orthopnea pnd or arm/hand swelling  or leg swelling, presyncope, palpitations, abdominal pain, anorexia, nausea, vomiting, diarrhea  or change in bowel habits or change in bladder habits, change in stools or change in urine, dysuria, hematuria,  rash, arthralgias, visual complaints, headache, numbness, weakness or ataxia or problems with walking or coordination,  change in mood or  memory.        Current Meds   Medication Sig  . albuterol (VENTOLIN HFA) 108 (90 Base) MCG/ACT inhaler Inhale 2 puffs into the lungs every 6 (six) hours as needed for wheezing or shortness of breath.  Marland Kitchen amLODipine (NORVASC) 5 MG tablet Take 1 tablet (5 mg total) by mouth daily.  . budesonide-formoterol (SYMBICORT) 80-4.5 MCG/ACT inhaler Inhale 2 puffs into the lungs 2 (two) times daily.  . cetirizine (ZYRTEC) 10 MG tablet Take 1 tablet (10 mg total) by mouth daily.  . montelukast (SINGULAIR) 10 MG tablet Take 1 tablet (10 mg total) by mouth at bedtime.  Marland Kitchen omeprazole (PRILOSEC) 20 MG capsule Take 1 capsule (20 mg total) by mouth daily.                 Past Medical History:  Diagnosis Date  . Asthma   . Hypertension        Objective:     06/15/2020       237   05/25/20 236 lb 0.1 oz (107.1 kg)  04/17/20 229 lb (103.9 kg)  04/06/20 226 lb 8 oz (102.7 kg)      Vital signs reviewed  06/15/2020  - Note at rest 02 sats  100% on RA   General appearance:    Pleasant obese bf nad    HEENT : pt wearing mask not removed for exam due to covid -19 concerns.    NECK :  without JVD/Nodes/TM/ nl carotid upstrokes bilaterally   LUNGS: no acc muscle use,  Nl contour chest which is clear to A and P bilaterally without cough on insp or exp maneuvers   CV:  RRR  no s3 or murmur or increase in P2, and no edema   ABD:  soft and nontender with nl inspiratory excursion in the supine position. No bruits or organomegaly appreciated, bowel sounds nl  MS:  Nl gait/ ext warm without deformities, calf tenderness, cyanosis or clubbing No obvious joint restrictions   SKIN: warm and dry without lesions    NEURO:  alert, approp, nl sensorium with  no motor or cerebellar deficits apparent.     Assessment

## 2020-06-16 ENCOUNTER — Encounter: Payer: Self-pay | Admitting: Internal Medicine

## 2020-06-16 NOTE — Assessment & Plan Note (Signed)
Onset ? Around 2016 on background of lifelong seasonal rhinitis - 04/06/2020    continue symbicort 160 2bid  - 06/15/2020  After extensive coaching inhaler device,  effectiveness =    75% from a baseline on < 25 %  Should be able to eliminate all asthma symptoms and normalize lung function on symb 160 then consider stedp down when returns in 3 months for pfts.   >>> advised: Patient is unvaccinated and was informed of the seriousness of COVID 19 infection as a direct risk to lung health as well as safety to close contacts and should continue to wear a facemask in public and minimize exposure to public locations but especially avoid any area or activity where non-close contacts are not observing distancing or wearing an appropriate face mask.  I strongly recommended pt take either of the vaccines available through local drugstores based on updated information on millions of Americans treated with the Moderna and [employer] products  which have proven both safe and  effective even against the   new omicron variant to prevent hospitalization and death.          Each maintenance medication was reviewed in detail including emphasizing most importantly the difference between maintenance and prns and under what circumstances the prns are to be triggered using an action plan format where appropriate.  Total time for H and P, chart review, counseling, reviewing hfa device(s) and generating customized AVS unique to this office visit / same day charting = 20 min

## 2020-08-14 ENCOUNTER — Ambulatory Visit: Payer: BC Managed Care – PPO | Admitting: Nurse Practitioner

## 2020-09-09 ENCOUNTER — Other Ambulatory Visit (HOSPITAL_COMMUNITY): Payer: BC Managed Care – PPO

## 2020-09-13 ENCOUNTER — Ambulatory Visit: Payer: BC Managed Care – PPO | Admitting: Primary Care

## 2020-09-13 ENCOUNTER — Ambulatory Visit: Payer: BC Managed Care – PPO | Admitting: Internal Medicine

## 2020-10-02 ENCOUNTER — Other Ambulatory Visit (HOSPITAL_COMMUNITY)
Admission: RE | Admit: 2020-10-02 | Discharge: 2020-10-02 | Disposition: A | Discharge status: Home / Self Care | Payer: BC Managed Care – PPO | Source: Ambulatory Visit | Attending: Internal Medicine | Admitting: Internal Medicine

## 2020-10-02 DIAGNOSIS — Z01812 Encounter for preprocedural laboratory examination: Secondary | ICD-10-CM | POA: Insufficient documentation

## 2020-10-02 DIAGNOSIS — Z20822 Contact with and (suspected) exposure to covid-19: Secondary | ICD-10-CM | POA: Insufficient documentation

## 2020-10-02 LAB — SARS CORONAVIRUS 2 (TAT 6-24 HRS): SARS Coronavirus 2: NEGATIVE

## 2020-10-03 ENCOUNTER — Encounter: Payer: Self-pay | Admitting: *Deleted

## 2020-10-04 ENCOUNTER — Other Ambulatory Visit: Payer: Self-pay

## 2020-10-04 ENCOUNTER — Encounter: Payer: Self-pay | Admitting: *Deleted

## 2020-10-04 ENCOUNTER — Ambulatory Visit (INDEPENDENT_AMBULATORY_CARE_PROVIDER_SITE_OTHER): Payer: BC Managed Care – PPO | Admitting: Internal Medicine

## 2020-10-04 ENCOUNTER — Ambulatory Visit: Payer: BC Managed Care – PPO | Admitting: Internal Medicine

## 2020-10-04 DIAGNOSIS — J4520 Mild intermittent asthma, uncomplicated: Secondary | ICD-10-CM | POA: Diagnosis not present

## 2020-10-04 DIAGNOSIS — E66811 Obesity, class 1: Secondary | ICD-10-CM

## 2020-10-04 DIAGNOSIS — R059 Cough, unspecified: Secondary | ICD-10-CM | POA: Diagnosis not present

## 2020-10-04 DIAGNOSIS — E669 Obesity, unspecified: Secondary | ICD-10-CM | POA: Diagnosis not present

## 2020-10-04 LAB — PULMONARY FUNCTION TEST
DL/VA % pred: 87 %
DL/VA: 3.75 ml/min/mmHg/L
DLCO cor % pred: 62 %
DLCO cor: 14.23 ml/min/mmHg
DLCO unc % pred: 62 %
DLCO unc: 14.23 ml/min/mmHg
FEF 25-75 Post: 2.54 L/sec
FEF 25-75 Pre: 2.26 L/sec
FEF2575-%Change-Post: 12 %
FEF2575-%Pred-Post: 94 %
FEF2575-%Pred-Pre: 84 %
FEV1-%Change-Post: 1 %
FEV1-%Pred-Post: 82 %
FEV1-%Pred-Pre: 81 %
FEV1-Post: 2.12 L
FEV1-Pre: 2.09 L
FEV1FVC-%Change-Post: 2 %
FEV1FVC-%Pred-Pre: 101 %
FEV6-%Change-Post: -1 %
FEV6-%Pred-Post: 80 %
FEV6-%Pred-Pre: 81 %
FEV6-Post: 2.48 L
FEV6-Pre: 2.52 L
FEV6FVC-%Pred-Post: 102 %
FEV6FVC-%Pred-Pre: 102 %
FVC-%Change-Post: -1 %
FVC-%Pred-Post: 78 %
FVC-%Pred-Pre: 79 %
FVC-Post: 2.48 L
FVC-Pre: 2.52 L
Post FEV1/FVC ratio: 85 %
Post FEV6/FVC ratio: 100 %
Pre FEV1/FVC ratio: 83 %
Pre FEV6/FVC Ratio: 100 %
RV % pred: 76 %
RV: 1.42 L
TLC % pred: 74 %
TLC: 4 L

## 2020-10-04 NOTE — Assessment & Plan Note (Addendum)
Onset ? Around 2016 on background of lifelong seasonal rhinitis - 04/06/2020    continue symbicort 160 2bid  - 06/15/2020  After extensive coaching inhaler device,  effectiveness =    75% from a baseline on < 25 % > continue symb 80 2bid  - PFTs 10/04/2020   wnl p am symbicort   10/04/2020  After extensive coaching inhaler device,  effectiveness =   80%    Ok to change symb 80 to 2bid prn Based on two studies from NEJM  378; 20 p 1865 (2018) and 380 : p2020-30 (2019) in pts with mild asthma it is reasonable to use low dose symbicort eg 80 2bid "prn" flare in this setting but I emphasized this was only shown with symbicort and takes advantage of the rapid onset of action but is not the same as "rescue therapy" but can be stopped once the acute symptoms have resolved and the need for rescue has been minimized (< 2 x weekly)     F/u can be prn

## 2020-10-04 NOTE — Assessment & Plan Note (Addendum)
ERV 66% at PFTs 10/04/2020 and wt 235   Body mass index is 37.93 kg/m.  -  trending down slightly, encouraged Lab Results  Component Value Date   TSH 2.090 05/15/2020     Contributing to gerd risk/ doe/reviewed the need and the process to achieve and maintain neg calorie balance > defer f/u primary care including intermittently monitoring thyroid status           Each maintenance medication was reviewed in detail including emphasizing most importantly the difference between maintenance and prns and under what circumstances the prns are to be triggered using an action plan format where appropriate.  Total time for H and P, chart review, counseling, reviewing hfa device(s) and generating customized AVS unique to this summary final office visit / same day charting = 34

## 2020-10-04 NOTE — Progress Notes (Signed)
PFT done today. 

## 2020-10-04 NOTE — Patient Instructions (Signed)
symbicort 80 is up to 2 puffs every 12 hours if you are any cough/ wheeze/short of breath or need for rescue inhaler   Work on inhaler technique:  relax and gently blow all the way out then take a nice smooth deep breath back in, triggering the inhaler at same time you start breathing in.  Hold for up to 5 seconds if you can. Blow out thru nose. Rinse and gargle with water when done     To get the most out of exercise, you need to be continuously aware that you are short of breath, but never out of breath, for at least 30 minutes daily. As you improve, it will actually be easier for you to do the same amount of exercise  in  30 minutes so always push to the level where you are short of breath.     If you are satisfied with your treatment plan,  let your doctor know and he/she can either refill your medications or you can return here when your prescription runs out.     If in any way you are not 100% satisfied,  please tell us.  If 100% better, tell your friends!  Pulmonary follow up is as needed

## 2020-10-04 NOTE — Progress Notes (Signed)
Diana Johnson, female    DOB: September 10, 1970,    MRN: [id number]   Brief patient profile:  73 yobf never smoker good athlete with problems with seasonal rhinitis spring = fall testing ? Tested in HS rx otc never wheeze or cough and overall pattern worse over the years less responsive to clariton and but eventually  Developed ? EIA around 2016 rx albuterol and good control then dx covid 19 Aug 8th 2021 with cough / sob s  Specific rx or admit >>>   Better since starting symbicort/ change zyrtec last pred oct 2021 referred to pulmonary clinic 04/06/2020 by Angus Seller NP     History of Present Illness  04/06/2020  Pulmonary/ 1st office eval/Patty Leitzke  Chief Complaint  Patient presents with  . Consult    cough is better now, but her asthma is still not better after having covid.   Dyspnea:  Steps are a problem, gained 30 lbs on prednisone p covid, otherwise breathing much better vs prior Cough: none now Sleep: flat bed/ 2 pillows / no resp symptoms since on symbicort  SABA use: since symbicort only needs saba 1-2 x  Week despite poor hfa teachniqe rec Plan A = Automatic = Always=   Symbicort  80 Take 2 puffs first thing in am and then another 2 puffs about 12 hours later.  Work on inhaler technique:   Keep the cannister for training  Plan B = Backup (to supplement plan A, not to replace it) Only use your albuterol inhaler as a rescue medication  Try albuterol 15 min before an activity that you know would make you short of breath   Make sure you check your oxygen saturations at highest level of activity     06/15/2020  f/u ov/Djuna Frechette re: asthma worse since covid 12/2019 - still not vaccinated  Chief Complaint  Patient presents with  . Follow-up    Doing good   Mild intermittent asthma Onset ? 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 Allergic rhinitis > rx symb 80 2bid and singulair  Chief Complaint  Patient presents with  . Follow-up    No complaints. Have not used albuterol in months. Symbicort working well.  Dyspnea:   Not limited by breathing from desired activities  / steps ok  Cough: gone  Sleeping: fine on 1.5 pillows  SABA use: none  02: none  Covid status:   Planning soon   No obvious day to day or daytime variability or assoc excess/ purulent sputum or mucus plugs or hemoptysis or cp or chest tightness, subjective wheeze or overt sinus or hb symptoms.   skeeping  without nocturnal  or early am exacerbation  of respiratory  c/o's or need for noct saba. Also denies any obvious fluctuation of symptoms with weather or environmental changes or other aggravating or  alleviating factors except as outlined above   No unusual exposure hx or h/o childhood pna/ asthma or knowledge of premature birth.  Current Allergies, Complete Past Medical History, Past Surgical History, Family History, and Social History were reviewed in [employer] record.  ROS  The following are not active complaints unless bolded Hoarseness, sore throat, dysphagia, dental problems, itching, sneezing,  nasal congestion or discharge of excess mucus or purulent secretions, ear ache,   fever, chills, sweats, unintended wt loss or wt gain, classically pleuritic or exertional cp,  orthopnea pnd or arm/hand swelling  or leg swelling, presyncope, palpitations, abdominal pain, anorexia, nausea, vomiting, diarrhea  or change in  bowel habits or change in bladder habits, change in stools or change in urine, dysuria, hematuria,  rash, arthralgias, visual complaints, headache, numbness, weakness or ataxia or problems with walking or coordination,  change in mood or  memory.        Current Meds  Medication Sig  . albuterol (VENTOLIN HFA) 108 (90 Base) MCG/ACT inhaler Inhale 2 puffs into the lungs every 6 (six) hours as needed for wheezing or shortness of breath.  Marland Kitchen amLODipine (NORVASC) 5 MG tablet Take 1 tablet (5 mg total) by mouth daily.  . budesonide-formoterol (SYMBICORT) 80-4.5 MCG/ACT inhaler Inhale 2 puffs into the lungs 2 (two) times daily.  . cetirizine (ZYRTEC) 10 MG tablet Take 1 tablet (10 mg total) by mouth daily.  . montelukast (SINGULAIR) 10 MG tablet Take 1 tablet (10 mg total) by mouth at bedtime.  Marland Kitchen omeprazole (PRILOSEC) 20 MG capsule Take 1 capsule (20 mg total) by mouth daily.            Past Medical History:  Diagnosis Date  . Asthma   . Hypertension        Objective:    10/04/2020       235 06/15/2020       237   05/25/20 236 lb 0.1 oz (107.1 kg)  04/17/20 229 lb (103.9 kg)  04/06/20 226 lb 8 oz (102.7 kg)    Vital signs reviewed  10/04/2020  - Note at rest 02 sats  97% on RA   General appearance:    amb obese bf nad   HEENT : pt wearing mask not removed for exam due to covid -19 concerns.    NECK :  without JVD/Nodes/TM/ nl carotid upstrokes bilaterally   LUNGS: no acc muscle use,  Nl contour chest which is clear to A and P bilaterally without cough on insp or exp maneuvers   CV:  RRR  no s3 or murmur or increase in P2, and no edema   ABD:  soft and nontender with nl inspiratory excursion in the supine position. No bruits or organomegaly appreciated, bowel sounds nl  MS:  Nl gait/ ext warm without deformities, calf tenderness, cyanosis or clubbing No obvious joint restrictions   SKIN: warm and dry without lesions    NEURO:  alert, approp, nl sensorium with  no motor or  cerebellar deficits apparent.             Assessment

## 2020-11-24 ENCOUNTER — Ambulatory Visit: Payer: BC Managed Care – PPO | Attending: Internal Medicine

## 2020-11-24 DIAGNOSIS — Z23 Encounter for immunization: Secondary | ICD-10-CM

## 2020-11-24 NOTE — Progress Notes (Signed)
   Covid-19 Vaccination Clinic  Name:  Diana Johnson    MRN: [id number] DOB: [birth year]  11/24/2020  [name] was observed post Covid-19 immunization for 15 minutes without incident. She was provided with Vaccine Information Sheet and instruction to access the V-Safe system.   [name] was instructed to call 911 with any severe reactions post vaccine: Difficulty breathing  Swelling of face and throat  A fast heartbeat  A bad rash all over body  Dizziness and weakness   Immunizations Administered     Name Date Dose VIS Date Route   PFIZER Comrnaty(Gray TOP) Covid-19 Vaccine 11/24/2020  2:52 PM 0.3 mL 05/04/2020 Intramuscular   Manufacturer: [employer], [employer]   Lot: [id number]   NDC: [phone]

## 2020-11-28 ENCOUNTER — Other Ambulatory Visit (HOSPITAL_BASED_OUTPATIENT_CLINIC_OR_DEPARTMENT_OTHER): Payer: Self-pay

## 2020-11-28 MED ORDER — COVID-19 MRNA VAC-TRIS(PFIZER) 30 MCG/0.3ML IM SUSP
INTRAMUSCULAR | 0 refills | Status: AC
  Filled 2020-11-28: qty 0.3, 1d supply, fill #0

## 2021-02-02 ENCOUNTER — Ambulatory Visit: Payer: BC Managed Care – PPO | Attending: Internal Medicine

## 2021-02-02 DIAGNOSIS — Z23 Encounter for immunization: Secondary | ICD-10-CM

## 2021-02-02 NOTE — Progress Notes (Signed)
   Covid-19 Vaccination Clinic  Name:  Diana Johnson    MRN: [id number] DOB: [birth year]  02/02/2021  [name] was observed post Covid-19 immunization for 15 minutes without incident. She was provided with Vaccine Information Sheet and instruction to access the V-Safe system.   [name] was instructed to call 911 with any severe reactions post vaccine: Difficulty breathing  Swelling of face and throat  A fast heartbeat  A bad rash all over body  Dizziness and weakness   Immunizations Administered     Name Date Dose VIS Date Route   PFIZER Comrnaty(Gray TOP) Covid-19 Vaccine 02/02/2021  1:27 PM 0.3 mL 05/04/2020 Intramuscular   Manufacturer: [employer], [employer]   Lot: [id number]   NDC: [phone]

## 2021-05-01 DIAGNOSIS — H9201 Otalgia, right ear: Secondary | ICD-10-CM | POA: Diagnosis not present

## 2021-05-01 DIAGNOSIS — H66001 Acute suppurative otitis media without spontaneous rupture of ear drum, right ear: Secondary | ICD-10-CM | POA: Diagnosis not present

## 2022-01-18 IMAGING — CT CT ANGIO CHEST
2 of 8 series · 19 of 36 positions shown · IV contrast (Omnipaque)
Comparison: Chest x-ray from earlier in the same day.

CLINICAL DATA: Cough and weakness with history of PJ1RP-AF
positivity

EXAM:
CT ANGIOGRAPHY CHEST WITH CONTRAST
TECHNIQUE: Multidetector CT imaging of the chest was performed using the
standard protocol during bolus administration of intravenous
contrast. Multiplanar CT image reconstructions and MIPs were
obtained to evaluate the vascular anatomy.
CONTRAST:  100mL OMNIPAQUE IOHEXOL 350 MG/ML SOLN

[Series 6: pe coronal mpr · coronal · 0.58mm/px · 1 of 151 slices shown]
[im 76/151  mediastinal]
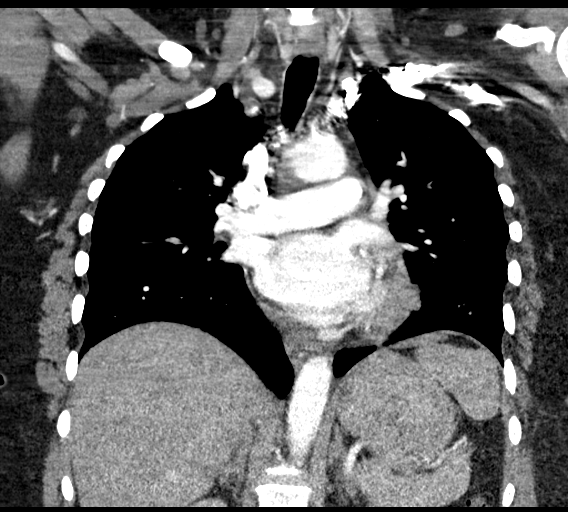

[Series 10: pe thins · axial · 0.71mm/px · z∈[-89,+166]mm · 18 of 286 slices shown]
[im 16/286  lung]
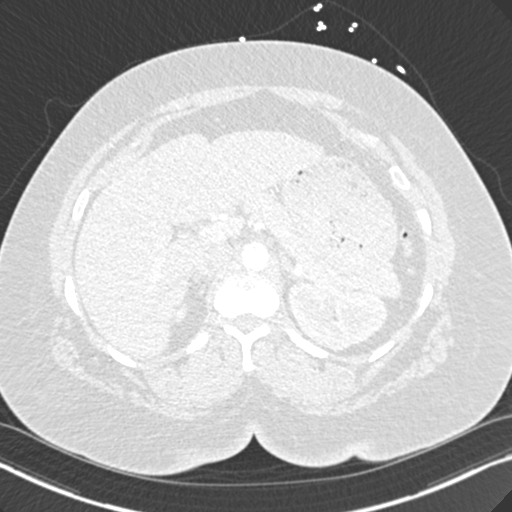
[im 31/286  mediastinal]
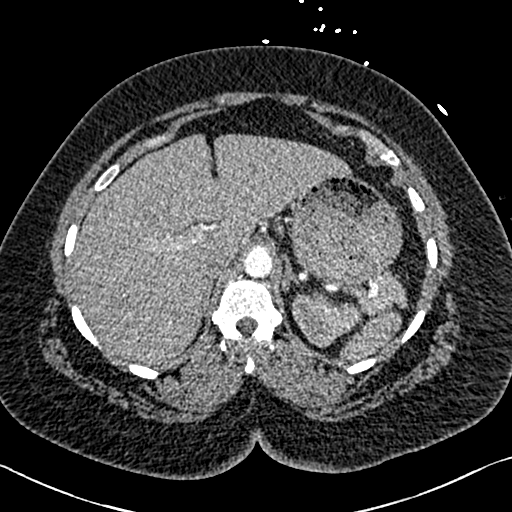
[im 46/286  lung]
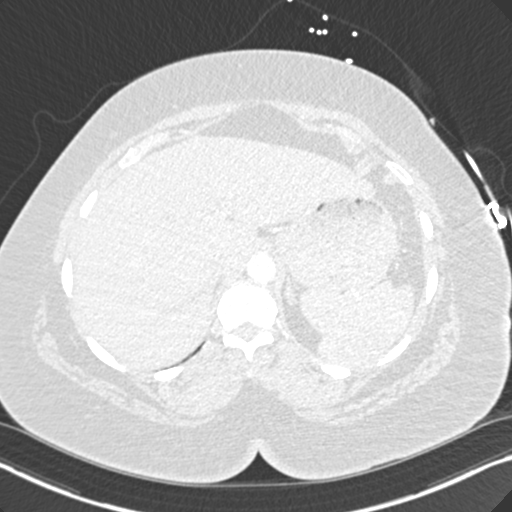
[im 61/286  mediastinal]
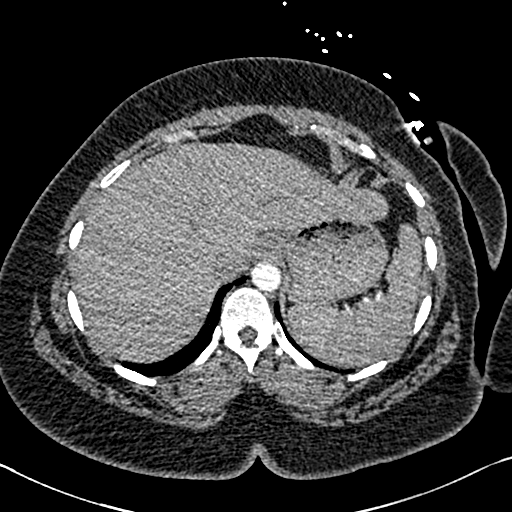
[im 76/286  lung]
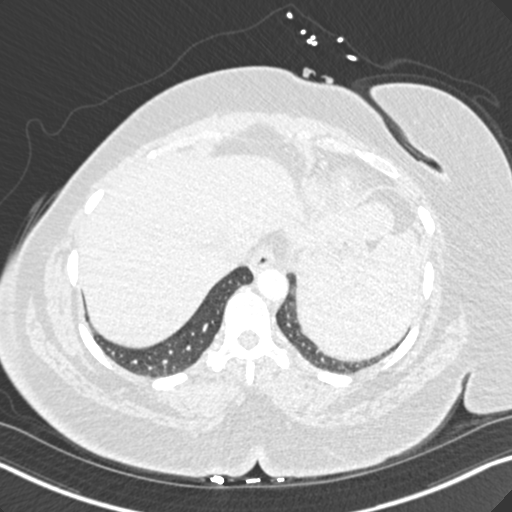
[im 91/286  mediastinal]
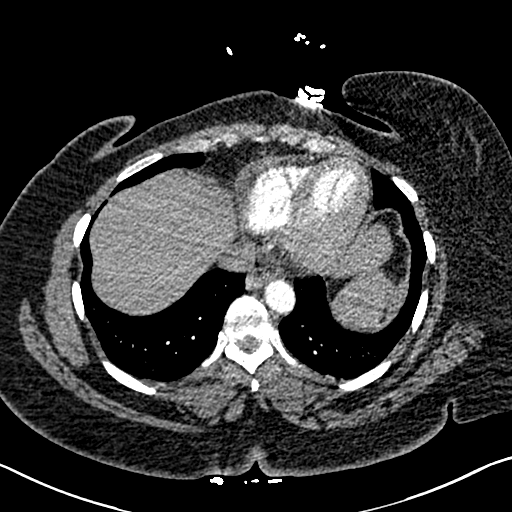
[im 106/286  lung]
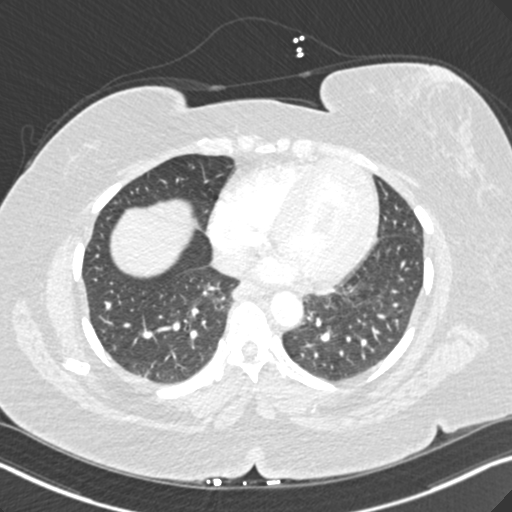
[im 121/286  mediastinal]
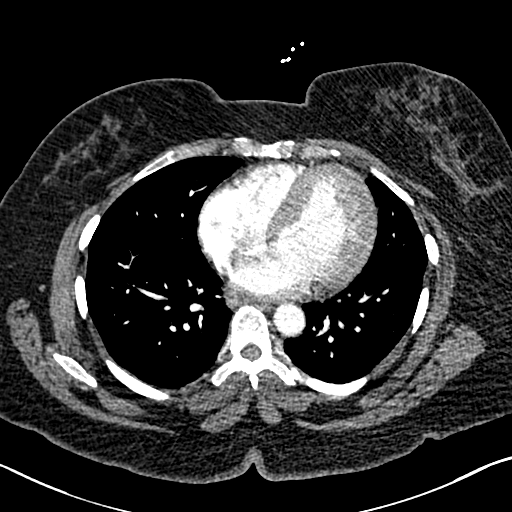
[im 136/286  lung]
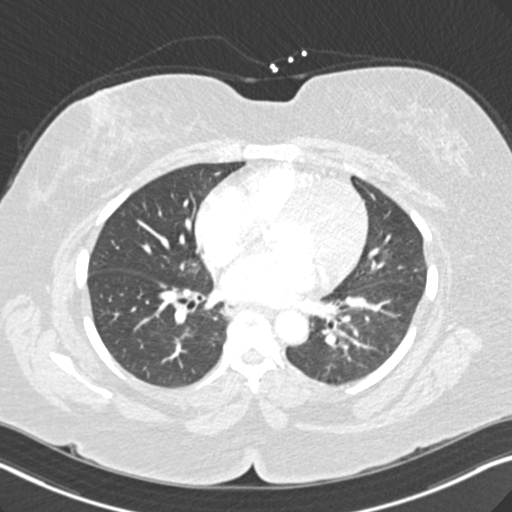
[im 151/286  mediastinal]
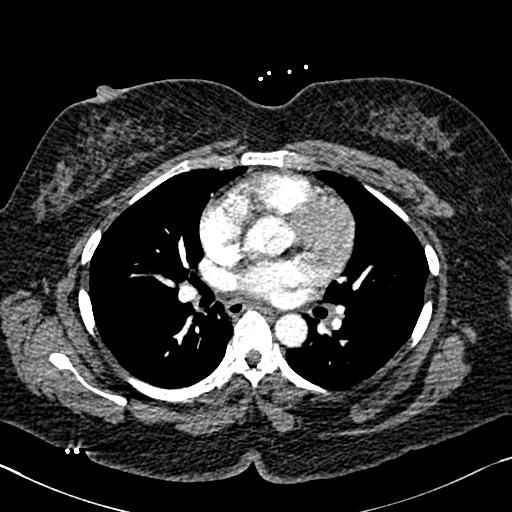
[im 166/286  lung]
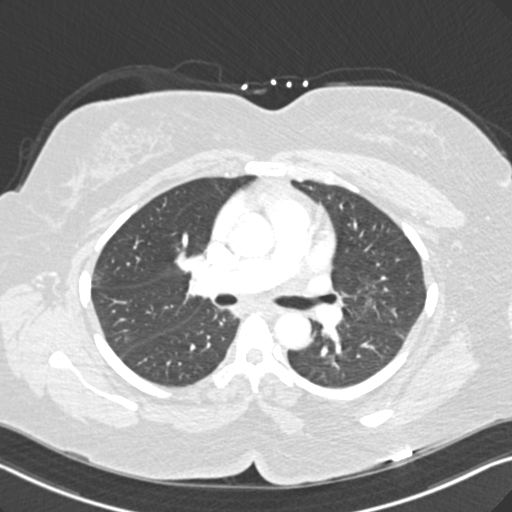
[im 181/286  mediastinal]
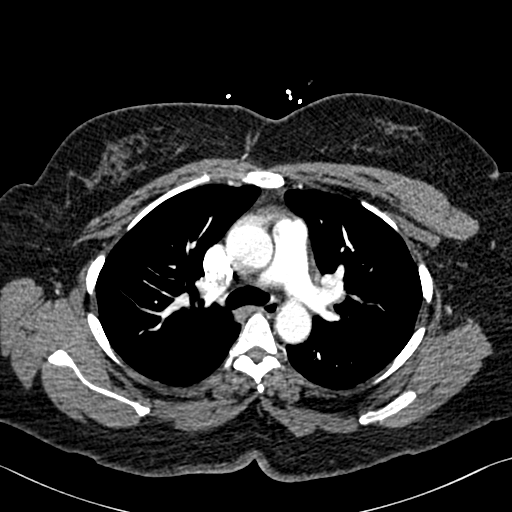
[im 196/286  lung]
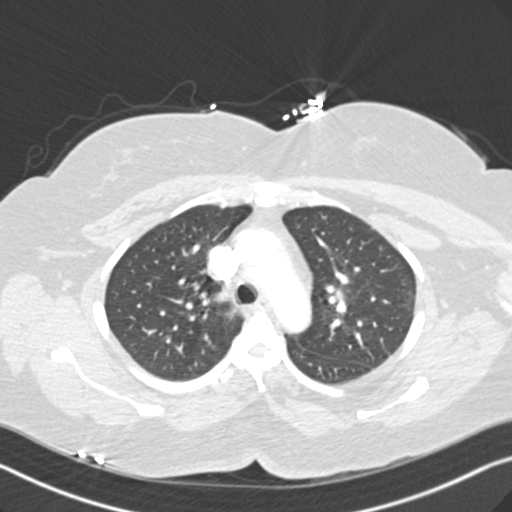
[im 211/286  mediastinal]
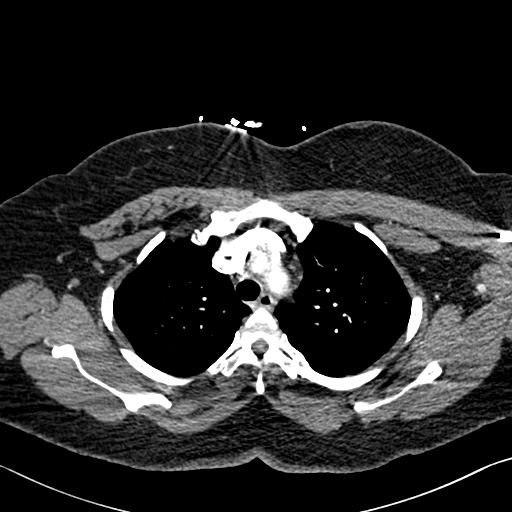
[im 226/286  lung]
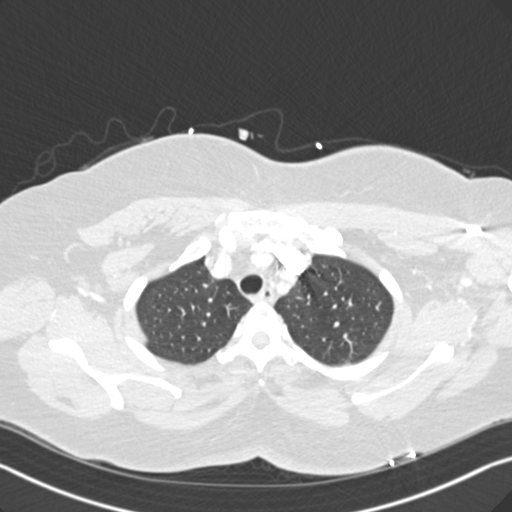
[im 241/286  mediastinal]
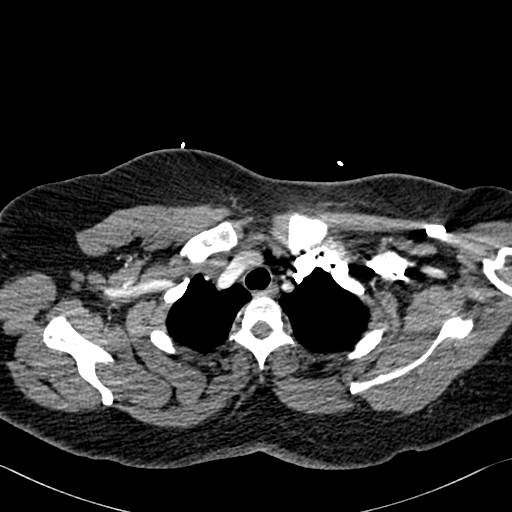
[im 256/286  lung]
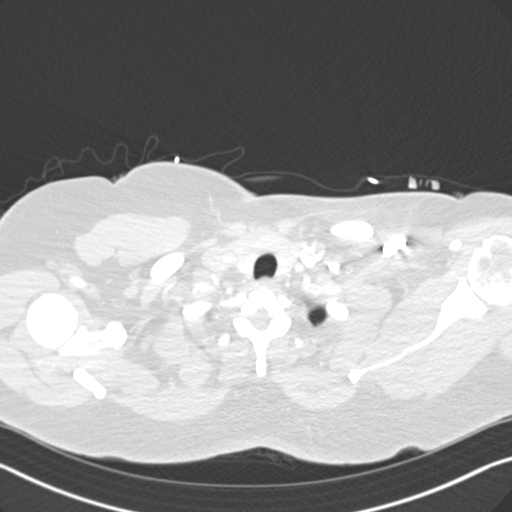
[im 271/286  mediastinal]
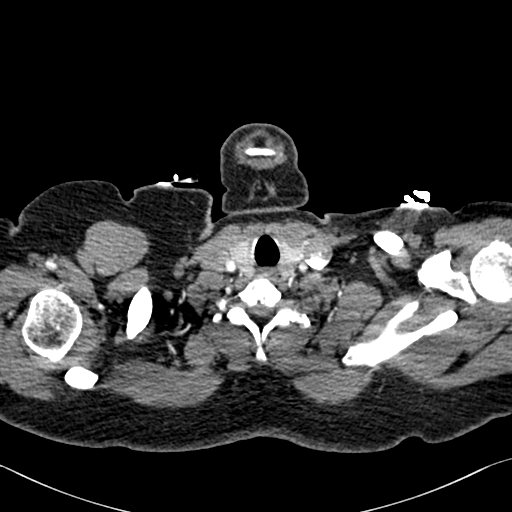

[19 of 36 positions shown; findings below may reference images not displayed]

FINDINGS: Cardiovascular: Thoracic aorta and its branches are within normal
limits. No cardiac enlargement is seen. The pulmonary artery shows a
normal branching pattern. No definitive filling defect to suggest
pulmonary embolism is noted.

Mediastinum/Nodes: Thoracic inlet is within normal limits. The
esophagus as visualized is unremarkable. No sizable hilar or
mediastinal adenopathy is noted.

Lungs/Pleura: Lungs are well aerated bilaterally. Minimal
ground-glass opacities are seen consistent with residual changes
from the patient's known history of PJ1RP-AF positivity. No sizable
confluent infiltrate is seen. No pneumothorax or pleural effusion is
noted.

Upper Abdomen: Changes of prior cholecystectomy are seen. The
remainder of the upper abdomen appears within normal limits.

Musculoskeletal: Degenerative changes of the thoracic spine are
seen. No acute bony abnormality is noted.

Review of the MIP images confirms the above findings.
IMPRESSION: No evidence of pulmonary emboli.

Minimal ground-glass opacities within the lungs bilaterally
consistent with the known history of prior PJ1RP-AF positivity.

No other focal abnormality is seen.

## 2022-09-29 ENCOUNTER — Other Ambulatory Visit: Payer: Self-pay

## 2022-09-29 ENCOUNTER — Emergency Department (HOSPITAL_BASED_OUTPATIENT_CLINIC_OR_DEPARTMENT_OTHER): Payer: BC Managed Care – PPO

## 2022-09-29 ENCOUNTER — Emergency Department (HOSPITAL_BASED_OUTPATIENT_CLINIC_OR_DEPARTMENT_OTHER)
Admission: EM | Admit: 2022-09-29 | Discharge: 2022-09-29 | Disposition: A | Discharge status: Home / Self Care | Payer: BC Managed Care – PPO | Attending: Emergency Medicine | Admitting: Emergency Medicine

## 2022-09-29 ENCOUNTER — Encounter (HOSPITAL_BASED_OUTPATIENT_CLINIC_OR_DEPARTMENT_OTHER): Payer: Self-pay | Admitting: Emergency Medicine

## 2022-09-29 DIAGNOSIS — Y92410 Unspecified street and highway as the place of occurrence of the external cause: Secondary | ICD-10-CM | POA: Insufficient documentation

## 2022-09-29 DIAGNOSIS — M545 Low back pain, unspecified: Secondary | ICD-10-CM | POA: Diagnosis not present

## 2022-09-29 DIAGNOSIS — R519 Headache, unspecified: Secondary | ICD-10-CM | POA: Diagnosis not present

## 2022-09-29 DIAGNOSIS — M542 Cervicalgia: Secondary | ICD-10-CM | POA: Diagnosis not present

## 2022-09-29 MED ORDER — IBUPROFEN 600 MG PO TABS: 600.0000 mg | ORAL_TABLET | Freq: Four times a day (QID) | ORAL | 0 refills | Status: AC | PRN

## 2022-09-29 MED ORDER — ACETAMINOPHEN 500 MG PO TABS
1000.0000 mg | ORAL_TABLET | Freq: Once | ORAL | Status: AC
  Administered 2022-09-29: 1000 mg via ORAL
  Filled 2022-09-29: qty 2

## 2022-09-29 MED ORDER — KETOROLAC TROMETHAMINE 15 MG/ML IJ SOLN
15.0000 mg | Freq: Once | INTRAMUSCULAR | Status: AC
  Administered 2022-09-29: 15 mg via INTRAMUSCULAR
  Filled 2022-09-29: qty 1

## 2022-09-29 NOTE — ED Notes (Signed)
Pt not taking BP meds x 5 months
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
